# Patient Record
Sex: Female | Born: 1960 | Race: White | Hispanic: No | Marital: Single | Smoking: Former smoker
Health system: Southern US, Community
[De-identification: ages and names within clinical notes are randomized; demographics above are authoritative.]

## PROBLEM LIST (undated history)

## (undated) DIAGNOSIS — M1990 Unspecified osteoarthritis, unspecified site: Secondary | ICD-10-CM

## (undated) DIAGNOSIS — K8020 Calculus of gallbladder without cholecystitis without obstruction: Secondary | ICD-10-CM

## (undated) DIAGNOSIS — I10 Essential (primary) hypertension: Secondary | ICD-10-CM

## (undated) DIAGNOSIS — C801 Malignant (primary) neoplasm, unspecified: Secondary | ICD-10-CM

## (undated) DIAGNOSIS — E039 Hypothyroidism, unspecified: Secondary | ICD-10-CM

## (undated) DIAGNOSIS — E119 Type 2 diabetes mellitus without complications: Secondary | ICD-10-CM

## (undated) DIAGNOSIS — E079 Disorder of thyroid, unspecified: Secondary | ICD-10-CM

## (undated) DIAGNOSIS — O009 Unspecified ectopic pregnancy without intrauterine pregnancy: Secondary | ICD-10-CM

## (undated) DIAGNOSIS — E785 Hyperlipidemia, unspecified: Secondary | ICD-10-CM

## (undated) DIAGNOSIS — K219 Gastro-esophageal reflux disease without esophagitis: Secondary | ICD-10-CM

## (undated) HISTORY — PX: OTHER SURGICAL HISTORY: SHX169

## (undated) HISTORY — DX: Type 2 diabetes mellitus without complications: E11.9

## (undated) HISTORY — DX: Disorder of thyroid, unspecified: E07.9

## (undated) HISTORY — DX: Malignant (primary) neoplasm, unspecified: C80.1

## (undated) HISTORY — PX: ENDOMETRIAL ABLATION: SHX621

## (undated) HISTORY — DX: Gastro-esophageal reflux disease without esophagitis: K21.9

## (undated) HISTORY — PX: TUBAL LIGATION: SHX77

## (undated) HISTORY — DX: Essential (primary) hypertension: I10

## (undated) HISTORY — DX: Unspecified ectopic pregnancy without intrauterine pregnancy: O00.90

## (undated) HISTORY — DX: Hyperlipidemia, unspecified: E78.5

---

## 2016-09-26 DIAGNOSIS — F17211 Nicotine dependence, cigarettes, in remission: Secondary | ICD-10-CM | POA: Diagnosis not present

## 2016-09-26 DIAGNOSIS — C3411 Malignant neoplasm of upper lobe, right bronchus or lung: Secondary | ICD-10-CM | POA: Diagnosis not present

## 2016-10-14 ENCOUNTER — Encounter: Payer: Self-pay | Admitting: *Deleted

## 2016-10-14 DIAGNOSIS — E785 Hyperlipidemia, unspecified: Secondary | ICD-10-CM | POA: Insufficient documentation

## 2016-10-14 DIAGNOSIS — E119 Type 2 diabetes mellitus without complications: Secondary | ICD-10-CM

## 2016-10-14 DIAGNOSIS — E7800 Pure hypercholesterolemia, unspecified: Secondary | ICD-10-CM

## 2016-10-14 DIAGNOSIS — I10 Essential (primary) hypertension: Secondary | ICD-10-CM | POA: Insufficient documentation

## 2016-10-14 DIAGNOSIS — O009 Unspecified ectopic pregnancy without intrauterine pregnancy: Secondary | ICD-10-CM | POA: Insufficient documentation

## 2016-10-14 DIAGNOSIS — C801 Malignant (primary) neoplasm, unspecified: Secondary | ICD-10-CM | POA: Insufficient documentation

## 2016-10-14 DIAGNOSIS — K219 Gastro-esophageal reflux disease without esophagitis: Secondary | ICD-10-CM

## 2016-10-14 DIAGNOSIS — E079 Disorder of thyroid, unspecified: Secondary | ICD-10-CM | POA: Insufficient documentation

## 2016-10-15 ENCOUNTER — Encounter: Payer: Self-pay | Admitting: *Deleted

## 2016-10-15 ENCOUNTER — Encounter: Payer: Self-pay | Admitting: Thoracic Surgery (Cardiothoracic Vascular Surgery)

## 2016-10-15 ENCOUNTER — Institutional Professional Consult (permissible substitution) (INDEPENDENT_AMBULATORY_CARE_PROVIDER_SITE_OTHER): Payer: BLUE CROSS/BLUE SHIELD | Admitting: Thoracic Surgery (Cardiothoracic Vascular Surgery)

## 2016-10-15 ENCOUNTER — Other Ambulatory Visit: Payer: Self-pay | Admitting: *Deleted

## 2016-10-15 VITALS — BP 114/66 | HR 92 | Resp 16 | Ht 73.0 in | Wt 260.0 lb

## 2016-10-15 DIAGNOSIS — C3411 Malignant neoplasm of upper lobe, right bronchus or lung: Secondary | ICD-10-CM

## 2016-10-15 DIAGNOSIS — C801 Malignant (primary) neoplasm, unspecified: Secondary | ICD-10-CM

## 2016-10-15 DIAGNOSIS — C3491 Malignant neoplasm of unspecified part of right bronchus or lung: Secondary | ICD-10-CM

## 2016-10-15 NOTE — Progress Notes (Signed)
PCP is Ailene Rud, NP Referring Provider is No ref. provider found  Chief Complaint  Patient presents with  . Lung Lesion    RUL..CT LUNG BX 09/03/16, CT CHEST 07/15/16, PET 08/21/16, CT BRAIN 09/11/16, PFT  . Lung Cancer    ADENOCARCINOMA    HPI: [name] is sent for consultation regarding a right upper lobe lung cancer.  [name] is a 56 year old woman with a history of tobacco abuse (up to 2 packs per day for 44 years, currently less than 1 pack per day). She also has a history of hypertension, hyperlipidemia, type 2 diabetes, arthritis, cervical cancer and "thyroid disease."  She take 2-1/2 month history of a persistent cough. A CT chest showed a 2.6 cm mixed solid and groundglass nodule in the right upper lobe. The solid component was 1.3 cm. She also had a 9 mm groundglass nodule in the left upper lobe. There was a 5 mm subtle groundglass nodule in the superior segment of the right lower lobe. She had a PET/CT which showed this was hypermetabolic with an SUV of 3.9. There was no suspicious adenopathy is no evidence of regional or distant metastases. A CT-guided biopsy was done on 09/03/2016. It showed adenocarcinoma consistent with lung primary.  Her cough has improved, and she now has an occasional dry cough. She denies any chest pain, pressure, or tightness with exertion. She denies any shortness of breath other than with heavy exertion. She has not had any wheezing. She denies any headaches or visual changes. She has not had any change in appetite or weight loss. She does have arthritis particularly in her right knee, but does not had any new bone or joint pain.  Zubrod Score: At the time of surgery this patient's most appropriate activity status/level should be described as: '[x]'$     0    Normal activity, no symptoms '[]'$     1    Restricted in physical strenuous activity but ambulatory, able to do out light work '[]'$     2    Ambulatory and capable of self care, unable to do work  activities, up and about >50 % of waking hours                              '[]'$     3    Only limited self care, in bed greater than 50% of waking hours '[]'$     4    Completely disabled, no self care, confined to bed or chair '[]'$     5    Moribund    Past Medical History:  Diagnosis Date  . Cancer [facility])    h/o cervical  . Cancer ([place])    RULobe of the lung.Marland Kitchenadenocarcinoma per CT BX 09/03/16  . Diabetes mellitus without complication ([place])   . Ectopic pregnancy   . GERD (gastroesophageal reflux disease)   . Hyperlipidemia   . Hypertension   . Thyroid disease    hypothyroidism    Past Surgical History:  Procedure Laterality Date  . dilatation and curettage    . ENDOMETRIAL ABLATION    . resection of the left oviduct and ovary      Family History  Problem Relation Age of Onset  . Heart disease Mother   . Heart disease Father   . Diabetes Sister   . Hypertension Sister   . Cancer Maternal Aunt     BREAST CANCER    Social History Social History  Substance Use Topics  . Smoking status: Current Every Day Smoker    Packs/day: 2.00    Years: 44.00  . Smokeless tobacco: Never Used     Comment: BARELY A  PACK  . Alcohol use No    Current Outpatient Prescriptions  Medication Sig Dispense Refill  . amoxicillin (AMOXIL) 875 MG tablet Take 875 mg by mouth 2 (two) times daily.    Marland Kitchen aspirin EC 81 MG tablet Take 81 mg by mouth daily.    Marland Kitchen desloratadine (CLARINEX) 5 MG tablet Take 5 mg by mouth daily.    . ergocalciferol (VITAMIN D2) 50000 units capsule Take 50,000 Units by mouth once a week. X 8 weeks    . Ferrous Fumarate 29 MG TABS Take 1 tablet by mouth daily.    . Garlic 268 MG CAPS Take 1 capsule by mouth daily.    Marland Kitchen gemfibrozil (LOPID) 600 MG tablet Take 600 mg by mouth 2 (two) times daily before a meal.    . insulin glargine (LANTUS) 100 UNIT/ML injection Inject 20 Units into the skin 2 (two) times daily.     Marland Kitchen levothyroxine (SYNTHROID, LEVOTHROID) 50 MCG tablet Take 50 mcg  by mouth daily before breakfast.    . lisinopril (PRINIVIL,ZESTRIL) 20 MG tablet Take 20 mg by mouth daily.    . meclizine (ANTIVERT) 25 MG tablet Take 25 mg by mouth 3 (three) times daily as needed for dizziness.    . metFORMIN (GLUCOPHAGE) 1000 MG tablet Take 1,000 mg by mouth 2 (two) times daily with a meal.    . Omega-3 Fatty Acids (FISH OIL) 1000 MG CAPS Take 1 capsule by mouth daily.    Marland Kitchen omeprazole (PRILOSEC) 20 MG capsule Take 20 mg by mouth 2 (two) times daily before a meal.    . simvastatin (ZOCOR) 80 MG tablet Take 80 mg by mouth daily.    Marland Kitchen triamterene-hydrochlorothiazide (MAXZIDE-25) 37.5-25 MG tablet Take 1 tablet by mouth daily.     No current facility-administered medications for this visit.     No Known Allergies  Review of Systems  Constitutional: Negative for activity change, appetite change, chills, fever and unexpected weight change.  HENT: Positive for dental problem. Negative for trouble swallowing and voice change.   Eyes: Negative for visual disturbance.  Respiratory: Positive for cough. Negative for shortness of breath and wheezing.   Cardiovascular: Positive for leg swelling. Negative for chest pain and palpitations.  Gastrointestinal: Positive for abdominal pain. Negative for blood in stool.       Gall stones  Genitourinary: Positive for frequency. Negative for dysuria and hematuria.  Musculoskeletal: Positive for arthralgias and joint swelling.  Neurological: Negative for speech difficulty, weakness and headaches.  Hematological: Negative for adenopathy. Bruises/bleeds easily.  All other systems reviewed and are negative.   BP 114/66 (BP Location: Right Arm, Patient Position: Sitting, Cuff Size: Large)   Pulse 92   Resp 16   Ht '6\' 1"'$  (1.854 m)   Wt 260 lb (117.9 kg)   SpO2 97% Comment: ON RA  BMI 34.30 kg/m  Physical Exam  Constitutional: She is oriented to person, place, and time.  obese  HENT:  Head: Normocephalic and atraumatic.  Mouth/Throat:  No oropharyngeal exudate.  Eyes: Conjunctivae and EOM are normal. Pupils are equal, round, and reactive to light. No scleral icterus.  Neck: Neck supple. No thyromegaly present.  Cardiovascular: Normal rate, regular rhythm, normal heart sounds and intact distal pulses.  Exam reveals no gallop and no friction rub.  No murmur heard. Pulmonary/Chest: Effort normal and breath sounds normal. No respiratory distress. She has no wheezes. She has no rales.  Abdominal: Soft. She exhibits no distension. There is no tenderness.  Musculoskeletal: She exhibits no edema.  Lymphadenopathy:    She has no cervical adenopathy.  Neurological: She is alert and oriented to person, place, and time. No cranial nerve deficit.  Motor 5/5 bilaterally  Skin: Skin is warm and dry.  Vitals reviewed.    Diagnostic Tests: I personally reviewed the images from her PET/CT and CT guided biopsy that were done at [facility].  Official report on PET CT Impression 1. Malignant range FDG uptake is associated with the right upper lobe some solid nodule. This is suspicious for primary pulmonary neoplasm, favor adenocarcinoma. No hypermetabolic hilar or mediastinal lymph nodes and no evidence for distant metastatic disease. 2. Aortic atherosclerosis 3. Gallstones  Biopsy positive for adenocarcinoma  Pulmonary function testing FVC   3.05 (90%) FEV1  2.49 (91%) FEV1  2.52 (92%) post bronchodilator VC  3.05 (90%) DLCO 18.36 (73%)  Impression: 56 year old woman with history of tobacco abuse who has a mixed solid and sub solid nodule in the right upper lobe. The solid component measures 1.3 cm and the overall nodule is about 2.5 cm. Biopsy proven adenocarcinoma.  I recommended that we proceed with right VATS for a right upper lobectomy for definitive treatment of the invasive adenocarcinoma. I have discussed the general nature of the procedure, the need for general anesthesia, and the incisions to be used with Ms.  Rojas. We discussed the expected hospital stay, overall recovery and short and long term outcomes. I informed her of the indications, risks, benefits, and alternatives. She typically had questions regarding stereotactic radiation. She understand the risks of surgery include, but are not limited to death, stroke, MI, DVT/PE, bleeding, possible need for transfusion, infections, cardiac arrhythmias, as well as other organ system dysfunction including respiratory, renal, or GI complications.   She accepts the risk and agrees to proceed  Hypertension-blood pressure normal today on Maxide  Hyperlipidemia- currently being treated with Zocor and Lopid  Type 2 diabetes- on metformin and insulin  Plan: Right VATS, right upper lobectomy on Thursday, 10/26/2016  Melrose Nakayama, MD [facility] [phone]

## 2016-10-17 LAB — PULMONARY FUNCTION TEST

## 2016-10-28 ENCOUNTER — Encounter (HOSPITAL_COMMUNITY): Payer: Self-pay | Admitting: *Deleted

## 2016-10-28 ENCOUNTER — Encounter (HOSPITAL_COMMUNITY)
Admission: RE | Admit: 2016-10-28 | Discharge: 2016-10-28 | Disposition: A | Discharge status: Home / Self Care | Payer: BLUE CROSS/BLUE SHIELD | Source: Ambulatory Visit | Attending: Thoracic Surgery (Cardiothoracic Vascular Surgery) | Admitting: Thoracic Surgery (Cardiothoracic Vascular Surgery)

## 2016-10-28 ENCOUNTER — Ambulatory Visit (HOSPITAL_COMMUNITY)
Admission: RE | Admit: 2016-10-28 | Discharge: 2016-10-28 | Disposition: A | Discharge status: Home / Self Care | Payer: BLUE CROSS/BLUE SHIELD | Source: Ambulatory Visit | Attending: Thoracic Surgery (Cardiothoracic Vascular Surgery) | Admitting: Thoracic Surgery (Cardiothoracic Vascular Surgery)

## 2016-10-28 DIAGNOSIS — Z01812 Encounter for preprocedural laboratory examination: Secondary | ICD-10-CM | POA: Insufficient documentation

## 2016-10-28 DIAGNOSIS — Z0183 Encounter for blood typing: Secondary | ICD-10-CM | POA: Insufficient documentation

## 2016-10-28 DIAGNOSIS — C3491 Malignant neoplasm of unspecified part of right bronchus or lung: Secondary | ICD-10-CM | POA: Diagnosis not present

## 2016-10-28 DIAGNOSIS — Z01818 Encounter for other preprocedural examination: Secondary | ICD-10-CM | POA: Diagnosis not present

## 2016-10-28 HISTORY — DX: Unspecified osteoarthritis, unspecified site: M19.90

## 2016-10-28 HISTORY — DX: Calculus of gallbladder without cholecystitis without obstruction: K80.20

## 2016-10-28 HISTORY — DX: Hypothyroidism, unspecified: E03.9

## 2016-10-28 LAB — BLOOD GAS, ARTERIAL
Acid-base deficit: 1 mmol/L (ref 0.0–2.0)
BICARBONATE: 23.3 mmol/L (ref 20.0–28.0)
Drawn by: 421801
FIO2: 0.21
O2 Saturation: 94.5 %
PH ART: 7.389 (ref 7.350–7.450)
PO2 ART: 70.2 mmHg — AB (ref 83.0–108.0)
Patient temperature: 98.6
pCO2 arterial: 39.4 mmHg (ref 32.0–48.0)

## 2016-10-28 LAB — URINALYSIS, ROUTINE W REFLEX MICROSCOPIC
BILIRUBIN URINE: NEGATIVE
GLUCOSE, UA: NEGATIVE mg/dL
Hgb urine dipstick: NEGATIVE
KETONES UR: NEGATIVE mg/dL
Nitrite: NEGATIVE
PROTEIN: NEGATIVE mg/dL
Specific Gravity, Urine: 1.015 (ref 1.005–1.030)
pH: 5 (ref 5.0–8.0)

## 2016-10-28 LAB — COMPREHENSIVE METABOLIC PANEL
ALK PHOS: 57 U/L (ref 38–126)
ALT: 20 U/L (ref 14–54)
ANION GAP: 10 (ref 5–15)
AST: 27 U/L (ref 15–41)
Albumin: 3.9 g/dL (ref 3.5–5.0)
BILIRUBIN TOTAL: 0.2 mg/dL — AB (ref 0.3–1.2)
BUN: 19 mg/dL (ref 6–20)
CALCIUM: 9.1 mg/dL (ref 8.9–10.3)
CO2: 22 mmol/L (ref 22–32)
Chloride: 109 mmol/L (ref 101–111)
Creatinine, Ser: 1.22 mg/dL — ABNORMAL HIGH (ref 0.44–1.00)
GFR calc Af Amer: 56 mL/min — ABNORMAL LOW (ref >60)
GFR calc non Af Amer: 49 mL/min — ABNORMAL LOW (ref >60)
Glucose, Bld: 166 mg/dL — ABNORMAL HIGH (ref 65–99)
Potassium: 4.1 mmol/L (ref 3.5–5.1)
Sodium: 141 mmol/L (ref 135–145)
Total Protein: 6.6 g/dL (ref 6.5–8.1)

## 2016-10-28 LAB — CBC
HEMATOCRIT: 35.6 % — AB (ref 36.0–46.0)
HEMOGLOBIN: 11.8 g/dL — AB (ref 12.0–15.0)
MCH: 30.6 pg (ref 26.0–34.0)
MCHC: 33.1 g/dL (ref 30.0–36.0)
MCV: 92.5 fL (ref 78.0–100.0)
Platelets: 256 10*3/uL (ref 150–400)
RBC: 3.85 MIL/uL — ABNORMAL LOW (ref 3.87–5.11)
RDW: 13 % (ref 11.5–15.5)
WBC: 7.9 10*3/uL (ref 4.0–10.5)

## 2016-10-28 LAB — SURGICAL PCR SCREEN
MRSA, PCR: NEGATIVE
Staphylococcus aureus: NEGATIVE

## 2016-10-28 LAB — PROTIME-INR
INR: 0.99
Prothrombin Time: 13.1 seconds (ref 11.4–15.2)

## 2016-10-28 LAB — APTT: APTT: 28 s (ref 24–36)

## 2016-10-28 NOTE — Progress Notes (Signed)
PCP is Ailene Rud, NP Denies ever seeing a cardiologist. Denies ever having a stress test, card cath, or echo. Reports her fasting cbgs run 120's

## 2016-10-28 NOTE — Pre-Procedure Instructions (Signed)
[name]  10/28/2016      [facility] - [place], [place] - [address] AT [address] [place] [address] Phone: [phone] Fax: [phone]    Your procedure is scheduled on Jan 24  Report to [facility] at [phone].  Call this number if you have problems the morning of surgery:  [phone]   Remember:  Do not eat food or drink liquids after midnight.  Take these medicines the morning of surgery with A SIP OF WATER Bupropion (WellbutrinXL), Clarinex, Synthroid, Omeprazole (Prilosec),   Stop/ Take aspirin as directed by your Dr.  Stop taking BC's, Goody's, Herbal medications, Fish Oil, ibuprofen, Advil, Motrin, Aleve, Vitamins    How to Manage Your Diabetes Before and After Surgery  Why is it important to control my blood sugar before and after surgery? . Improving blood sugar levels before and after surgery helps healing and can limit problems. . A way of improving blood sugar control is eating a healthy diet by: o  Eating less sugar and carbohydrates o  Increasing activity/exercise o  Talking with your doctor about reaching your blood sugar goals . High blood sugars (greater than 180 mg/dL) can raise your risk of infections and slow your recovery, so you will need to focus on controlling your diabetes during the weeks before surgery. . Make sure that the doctor who takes care of your diabetes knows about your planned surgery including the date and location.  How do I manage my blood sugar before surgery? . Check your blood sugar at least 4 times a day, starting 2 days before surgery, to make sure that the level is not too high or low. o Check your blood sugar the morning of your surgery when you wake up and every 2 hours until you get to the Short Stay unit. . If your blood sugar is less than 70 mg/dL, you will need to treat for low blood sugar: o Do not take insulin. o Treat a low blood sugar  (less than 70 mg/dL) with  cup of clear juice (cranberry or apple), 4 glucose tablets, OR glucose gel. o Recheck blood sugar in 15 minutes after treatment (to make sure it is greater than 70 mg/dL). If your blood sugar is not greater than 70 mg/dL on recheck, call [phone] for further instructions. . Report your blood sugar to the short stay nurse when you get to Short Stay.  . If you are admitted to the hospital after surgery: o Your blood sugar will be checked by the staff and you will probably be given insulin after surgery (instead of oral diabetes medicines) to make sure you have good blood sugar levels. o The goal for blood sugar control after surgery is 80-180 mg/dL      WHAT DO I DO ABOUT MY DIABETES MEDICATION?   Marland Kitchen Do not take oral diabetes medicines (pills) the morning of surgery. Metformin (Glucophage)  . THE NIGHT BEFORE SURGERY, take ___________ units of ___________insulin.       Marland Kitchen HE MORNING OF SURGERY, take _____________ units of __________insulin.  . The day of surgery, do not take other diabetes injectables, including Byetta (exenatide), Bydureon (exenatide ER), Victoza (liraglutide), or Trulicity (dulaglutide).  . If your CBG is greater than 220 mg/dL, you may take  of your sliding scale (correction) dose of insulin.  Other Instructions:          Patient Signature:  Date:   Nurse Signature:  Date:   Reviewed and Endorsed by [facility] Patient Education Committee, August 2015  Do not wear jewelry, make-up or nail polish.  Do not wear lotions, powders, or perfumes, or deoderant.  Do not shave 48 hours prior to surgery.  Men may shave face and neck.  Do not bring valuables to the hospital.  [facility] is not responsible for any belongings or valuables.  Contacts, dentures or bridgework may not be worn into surgery.  Leave your suitcase in the car.  After surgery it may be brought to your room.  For patients admitted to the hospital, discharge time  will be determined by your treatment team.  Patients discharged the day of surgery will not be allowed to drive home.   Special instructions:  [facility] - Preparing for Surgery  Before surgery, you can play an important role.  Because skin is not sterile, your skin needs to be as free of germs as possible.  You can reduce the number of germs on you skin by washing with CHG (chlorahexidine gluconate) soap before surgery.  CHG is an antiseptic cleaner which kills germs and bonds with the skin to continue killing germs even after washing.  Please DO NOT use if you have an allergy to CHG or antibacterial soaps.  If your skin becomes reddened/irritated stop using the CHG and inform your nurse when you arrive at Short Stay.  Do not shave (including legs and underarms) for at least 48 hours prior to the first CHG shower.  You may shave your face.  Please follow these instructions carefully:   1.  Shower with CHG Soap the night before surgery and the  morning of Surgery.  2.  If you choose to wash your hair, wash your hair first as usual with your normal shampoo.  3.  After you shampoo, rinse your hair and body thoroughly to remove the  Shampoo.  4.  Use CHG as you would any other liquid soap.  You can apply chg directly  to the skin and wash gently with scrungie or a clean washcloth.  5.  Apply the CHG Soap to your body ONLY FROM THE NECK DOWN.   Do not use on open wounds or open sores.  Avoid contact with your eyes,       ears, mouth and genitals (private parts).  Wash genitals (private parts)   with your normal soap.  6.  Wash thoroughly, paying special attention to the area where your surgery  will be performed.  7.  Thoroughly rinse your body with warm water from the neck down.  8.  DO NOT shower/wash with your normal soap after using and rinsing off the CHG Soap.  9.  Pat yourself dry with a clean towel.            10.  Wear clean pajamas.            11.  Place clean sheets on your bed the  night of your first shower and do not sleep with pets.  Day of Surgery  Do not apply any lotions/deoderants the morning of surgery.  Please wear clean clothes to the hospital/surgery center.    Please read over the following fact sheets that you were given. Coughing and Deep Breathing, MRSA Information and Surgical Site Infection Prevention

## 2016-10-29 LAB — HEMOGLOBIN A1C
HEMOGLOBIN A1C: 6.8 % — AB (ref 4.8–5.6)
Mean Plasma Glucose: 148 mg/dL

## 2016-10-29 LAB — ABO/RH: ABO/RH(D): AB POS

## 2016-10-30 ENCOUNTER — Inpatient Hospital Stay (HOSPITAL_COMMUNITY): Payer: BLUE CROSS/BLUE SHIELD

## 2016-10-30 ENCOUNTER — Inpatient Hospital Stay (HOSPITAL_COMMUNITY): Payer: BLUE CROSS/BLUE SHIELD | Admitting: Anesthesiology

## 2016-10-30 ENCOUNTER — Encounter (HOSPITAL_COMMUNITY): Payer: Self-pay | Admitting: Certified Registered Nurse Anesthetist

## 2016-10-30 ENCOUNTER — Encounter (HOSPITAL_COMMUNITY)
Admission: RE | Disposition: A | Discharge status: Home / Self Care | Payer: Self-pay | Source: Ambulatory Visit | Attending: Thoracic Surgery (Cardiothoracic Vascular Surgery)

## 2016-10-30 ENCOUNTER — Inpatient Hospital Stay (HOSPITAL_COMMUNITY)
Admission: RE | Admit: 2016-10-30 | Discharge: 2016-11-05 | DRG: 164 | Disposition: A | Discharge status: Home / Self Care | Payer: BLUE CROSS/BLUE SHIELD | Source: Ambulatory Visit | Attending: Thoracic Surgery (Cardiothoracic Vascular Surgery) | Admitting: Thoracic Surgery (Cardiothoracic Vascular Surgery)

## 2016-10-30 DIAGNOSIS — E079 Disorder of thyroid, unspecified: Secondary | ICD-10-CM | POA: Diagnosis present

## 2016-10-30 DIAGNOSIS — Z8541 Personal history of malignant neoplasm of cervix uteri: Secondary | ICD-10-CM | POA: Diagnosis not present

## 2016-10-30 DIAGNOSIS — E669 Obesity, unspecified: Secondary | ICD-10-CM | POA: Diagnosis present

## 2016-10-30 DIAGNOSIS — K5900 Constipation, unspecified: Secondary | ICD-10-CM | POA: Diagnosis not present

## 2016-10-30 DIAGNOSIS — E785 Hyperlipidemia, unspecified: Secondary | ICD-10-CM | POA: Diagnosis present

## 2016-10-30 DIAGNOSIS — Z79899 Other long term (current) drug therapy: Secondary | ICD-10-CM

## 2016-10-30 DIAGNOSIS — C3411 Malignant neoplasm of upper lobe, right bronchus or lung: Principal | ICD-10-CM | POA: Diagnosis present

## 2016-10-30 DIAGNOSIS — D62 Acute posthemorrhagic anemia: Secondary | ICD-10-CM | POA: Diagnosis not present

## 2016-10-30 DIAGNOSIS — Z902 Acquired absence of lung [part of]: Secondary | ICD-10-CM

## 2016-10-30 DIAGNOSIS — F17200 Nicotine dependence, unspecified, uncomplicated: Secondary | ICD-10-CM | POA: Diagnosis present

## 2016-10-30 DIAGNOSIS — Z23 Encounter for immunization: Secondary | ICD-10-CM | POA: Diagnosis not present

## 2016-10-30 DIAGNOSIS — M1990 Unspecified osteoarthritis, unspecified site: Secondary | ICD-10-CM | POA: Diagnosis present

## 2016-10-30 DIAGNOSIS — Z7982 Long term (current) use of aspirin: Secondary | ICD-10-CM | POA: Diagnosis not present

## 2016-10-30 DIAGNOSIS — Z6834 Body mass index (BMI) 34.0-34.9, adult: Secondary | ICD-10-CM

## 2016-10-30 DIAGNOSIS — Z794 Long term (current) use of insulin: Secondary | ICD-10-CM | POA: Diagnosis not present

## 2016-10-30 DIAGNOSIS — Z09 Encounter for follow-up examination after completed treatment for conditions other than malignant neoplasm: Secondary | ICD-10-CM

## 2016-10-30 DIAGNOSIS — J939 Pneumothorax, unspecified: Secondary | ICD-10-CM | POA: Diagnosis present

## 2016-10-30 DIAGNOSIS — I10 Essential (primary) hypertension: Secondary | ICD-10-CM | POA: Diagnosis present

## 2016-10-30 DIAGNOSIS — C3491 Malignant neoplasm of unspecified part of right bronchus or lung: Secondary | ICD-10-CM

## 2016-10-30 DIAGNOSIS — G8918 Other acute postprocedural pain: Secondary | ICD-10-CM

## 2016-10-30 DIAGNOSIS — E119 Type 2 diabetes mellitus without complications: Secondary | ICD-10-CM | POA: Diagnosis present

## 2016-10-30 HISTORY — PX: VIDEO ASSISTED THORACOSCOPY (VATS)/ LOBECTOMY: SHX6169

## 2016-10-30 LAB — GLUCOSE, CAPILLARY
GLUCOSE-CAPILLARY: 124 mg/dL — AB (ref 65–99)
GLUCOSE-CAPILLARY: 179 mg/dL — AB (ref 65–99)
GLUCOSE-CAPILLARY: 189 mg/dL — AB (ref 65–99)
GLUCOSE-CAPILLARY: 197 mg/dL — AB (ref 65–99)
Glucose-Capillary: 213 mg/dL — ABNORMAL HIGH (ref 65–99)

## 2016-10-30 LAB — PREPARE RBC (CROSSMATCH)

## 2016-10-30 SURGERY — VIDEO ASSISTED THORACOSCOPY (VATS)/ LOBECTOMY
Anesthesia: General | Site: Chest | Laterality: Right

## 2016-10-30 MED ORDER — ASPIRIN EC 81 MG PO TBEC
81.0000 mg | DELAYED_RELEASE_TABLET | Freq: Every day | ORAL | Status: DC
  Administered 2016-10-31 – 2016-11-05 (×6): 81 mg via ORAL
  Filled 2016-10-30 (×6): qty 1

## 2016-10-30 MED ORDER — PHENYLEPHRINE 40 MCG/ML (10ML) SYRINGE FOR IV PUSH (FOR BLOOD PRESSURE SUPPORT)
PREFILLED_SYRINGE | INTRAVENOUS | Status: AC
  Filled 2016-10-30: qty 10

## 2016-10-30 MED ORDER — TRAMADOL HCL 50 MG PO TABS
50.0000 mg | ORAL_TABLET | Freq: Four times a day (QID) | ORAL | Status: DC | PRN
  Administered 2016-10-31: 100 mg via ORAL
  Filled 2016-10-30: qty 2

## 2016-10-30 MED ORDER — ONDANSETRON HCL 4 MG/2ML IJ SOLN: 4.0000 mg | Freq: Four times a day (QID) | INTRAMUSCULAR | Status: DC | PRN

## 2016-10-30 MED ORDER — BUPROPION HCL ER (XL) 150 MG PO TB24
150.0000 mg | ORAL_TABLET | Freq: Two times a day (BID) | ORAL | Status: DC
  Administered 2016-10-30 – 2016-11-05 (×12): 150 mg via ORAL
  Filled 2016-10-30 (×12): qty 1

## 2016-10-30 MED ORDER — LORATADINE 10 MG PO TABS
10.0000 mg | ORAL_TABLET | Freq: Every day | ORAL | Status: DC
  Administered 2016-10-31 – 2016-11-05 (×6): 10 mg via ORAL
  Filled 2016-10-30 (×6): qty 1

## 2016-10-30 MED ORDER — NALOXONE HCL 0.4 MG/ML IJ SOLN: 0.4000 mg | INTRAMUSCULAR | Status: DC | PRN

## 2016-10-30 MED ORDER — METFORMIN HCL 500 MG PO TABS
1000.0000 mg | ORAL_TABLET | Freq: Two times a day (BID) | ORAL | Status: DC
  Administered 2016-10-31 – 2016-11-05 (×11): 1000 mg via ORAL
  Filled 2016-10-30 (×11): qty 2

## 2016-10-30 MED ORDER — SUGAMMADEX SODIUM 200 MG/2ML IV SOLN
INTRAVENOUS | Status: DC | PRN
  Administered 2016-10-30: 200 mg via INTRAVENOUS

## 2016-10-30 MED ORDER — OXYCODONE HCL 5 MG PO TABS: 5.0000 mg | ORAL_TABLET | Freq: Once | ORAL | Status: DC | PRN

## 2016-10-30 MED ORDER — SUGAMMADEX SODIUM 200 MG/2ML IV SOLN
INTRAVENOUS | Status: AC
  Filled 2016-10-30: qty 2

## 2016-10-30 MED ORDER — DEXAMETHASONE SODIUM PHOSPHATE 10 MG/ML IJ SOLN
INTRAMUSCULAR | Status: DC | PRN
  Administered 2016-10-30: 10 mg via INTRAVENOUS

## 2016-10-30 MED ORDER — ROCURONIUM BROMIDE 50 MG/5ML IV SOSY
PREFILLED_SYRINGE | INTRAVENOUS | Status: AC
  Filled 2016-10-30: qty 5

## 2016-10-30 MED ORDER — FENTANYL CITRATE (PF) 100 MCG/2ML IJ SOLN
INTRAMUSCULAR | Status: DC | PRN
  Administered 2016-10-30 (×4): 50 ug via INTRAVENOUS
  Administered 2016-10-30: 100 ug via INTRAVENOUS
  Administered 2016-10-30 (×2): 50 ug via INTRAVENOUS

## 2016-10-30 MED ORDER — DEXAMETHASONE SODIUM PHOSPHATE 10 MG/ML IJ SOLN
INTRAMUSCULAR | Status: AC
  Filled 2016-10-30: qty 1

## 2016-10-30 MED ORDER — ONDANSETRON HCL 4 MG/2ML IJ SOLN
INTRAMUSCULAR | Status: AC
  Filled 2016-10-30: qty 2

## 2016-10-30 MED ORDER — OXYCODONE HCL 5 MG PO TABS
5.0000 mg | ORAL_TABLET | ORAL | Status: DC | PRN
  Administered 2016-10-30 (×2): 5 mg via ORAL
  Administered 2016-10-31: 10 mg via ORAL
  Administered 2016-10-31 (×2): 5 mg via ORAL
  Administered 2016-11-01 – 2016-11-05 (×9): 10 mg via ORAL
  Filled 2016-10-30: qty 2
  Filled 2016-10-30: qty 1
  Filled 2016-10-30 (×5): qty 2
  Filled 2016-10-30 (×2): qty 1
  Filled 2016-10-30 (×3): qty 2
  Filled 2016-10-30: qty 1
  Filled 2016-10-30 (×2): qty 2

## 2016-10-30 MED ORDER — 0.9 % SODIUM CHLORIDE (POUR BTL) OPTIME
TOPICAL | Status: DC | PRN
  Administered 2016-10-30: 2000 mL

## 2016-10-30 MED ORDER — DIPHENHYDRAMINE HCL 50 MG/ML IJ SOLN
12.5000 mg | Freq: Four times a day (QID) | INTRAMUSCULAR | Status: DC | PRN
Start: 2016-10-30 — End: 2016-11-02

## 2016-10-30 MED ORDER — FENTANYL 40 MCG/ML IV SOLN
INTRAVENOUS | Status: DC
  Administered 2016-10-30: 210 ug via INTRAVENOUS
  Administered 2016-10-30: 1000 ug via INTRAVENOUS
  Administered 2016-10-30: 30 ug via INTRAVENOUS
  Administered 2016-10-31: 150 ug via INTRAVENOUS
  Administered 2016-10-31: 1000 ug via INTRAVENOUS
  Administered 2016-10-31: 135 ug via INTRAVENOUS
  Administered 2016-10-31: 75 ug via INTRAVENOUS
  Administered 2016-10-31: 120 ug via INTRAVENOUS
  Administered 2016-10-31: 0 ug via INTRAVENOUS
  Administered 2016-11-01 (×2): 45 ug via INTRAVENOUS
  Administered 2016-11-01: 135 ug via INTRAVENOUS
  Administered 2016-11-01: 30 ug via INTRAVENOUS
  Administered 2016-11-01: 90 ug via INTRAVENOUS
  Administered 2016-11-02: 45 ug via INTRAVENOUS
  Administered 2016-11-02: 90 ug via INTRAVENOUS
  Administered 2016-11-02: 60 ug via INTRAVENOUS
  Administered 2016-11-02: 150 ug via INTRAVENOUS
  Filled 2016-10-30 (×5): qty 25

## 2016-10-30 MED ORDER — LACTATED RINGERS IV SOLN
INTRAVENOUS | Status: DC | PRN
  Administered 2016-10-30: 08:00:00 via INTRAVENOUS

## 2016-10-30 MED ORDER — ACETAMINOPHEN 500 MG PO TABS
1000.0000 mg | ORAL_TABLET | Freq: Four times a day (QID) | ORAL | Status: AC
  Administered 2016-10-30 – 2016-11-03 (×16): 1000 mg via ORAL
  Filled 2016-10-30 (×17): qty 2

## 2016-10-30 MED ORDER — LEVOTHYROXINE SODIUM 50 MCG PO TABS
50.0000 ug | ORAL_TABLET | Freq: Every day | ORAL | Status: DC
  Administered 2016-10-31 – 2016-11-05 (×6): 50 ug via ORAL
  Filled 2016-10-30 (×6): qty 1

## 2016-10-30 MED ORDER — TRIAMTERENE-HCTZ 37.5-25 MG PO TABS
1.0000 | ORAL_TABLET | Freq: Every day | ORAL | Status: DC
  Administered 2016-10-31 – 2016-11-03 (×3): 1 via ORAL
  Filled 2016-10-30 (×6): qty 1

## 2016-10-30 MED ORDER — BUPIVACAINE HCL (PF) 0.5 % IJ SOLN
INTRAMUSCULAR | Status: AC
  Filled 2016-10-30: qty 10

## 2016-10-30 MED ORDER — SODIUM CHLORIDE 0.9 % IV SOLN
30.0000 meq | Freq: Every day | INTRAVENOUS | Status: DC | PRN
  Filled 2016-10-30: qty 15

## 2016-10-30 MED ORDER — PROPOFOL 10 MG/ML IV BOLUS
INTRAVENOUS | Status: AC
  Filled 2016-10-30: qty 40

## 2016-10-30 MED ORDER — HYDROMORPHONE HCL 1 MG/ML IJ SOLN
INTRAMUSCULAR | Status: AC
  Filled 2016-10-30: qty 0.5

## 2016-10-30 MED ORDER — DIPHENHYDRAMINE HCL 12.5 MG/5ML PO ELIX: 12.5000 mg | ORAL_SOLUTION | Freq: Four times a day (QID) | ORAL | Status: DC | PRN

## 2016-10-30 MED ORDER — MIDAZOLAM HCL 2 MG/2ML IJ SOLN
INTRAMUSCULAR | Status: AC
  Filled 2016-10-30: qty 2

## 2016-10-30 MED ORDER — OXYCODONE HCL 5 MG/5ML PO SOLN: 5.0000 mg | Freq: Once | ORAL | Status: DC | PRN

## 2016-10-30 MED ORDER — BUPIVACAINE ON-Q PAIN PUMP (FOR ORDER SET NO CHG)
INJECTION | Status: AC
  Filled 2016-10-30: qty 1

## 2016-10-30 MED ORDER — PANTOPRAZOLE SODIUM 40 MG PO TBEC
40.0000 mg | DELAYED_RELEASE_TABLET | Freq: Every day | ORAL | Status: DC
  Administered 2016-10-31 – 2016-11-05 (×6): 40 mg via ORAL
  Filled 2016-10-30 (×6): qty 1

## 2016-10-30 MED ORDER — FENTANYL CITRATE (PF) 250 MCG/5ML IJ SOLN
INTRAMUSCULAR | Status: AC
  Filled 2016-10-30: qty 5

## 2016-10-30 MED ORDER — DEXTROSE 5 % IV SOLN
1.5000 g | INTRAVENOUS | Status: AC
  Administered 2016-10-30: 1.5 g via INTRAVENOUS
  Filled 2016-10-30: qty 1.5

## 2016-10-30 MED ORDER — PNEUMOCOCCAL VAC POLYVALENT 25 MCG/0.5ML IJ INJ
0.5000 mL | INJECTION | INTRAMUSCULAR | Status: AC
  Administered 2016-11-02: 0.5 mL via INTRAMUSCULAR
  Filled 2016-10-30 (×2): qty 0.5

## 2016-10-30 MED ORDER — ENOXAPARIN SODIUM 40 MG/0.4ML ~~LOC~~ SOLN
40.0000 mg | Freq: Every day | SUBCUTANEOUS | Status: DC
  Administered 2016-10-31 – 2016-11-04 (×5): 40 mg via SUBCUTANEOUS
  Filled 2016-10-30 (×6): qty 0.4

## 2016-10-30 MED ORDER — LEVALBUTEROL HCL 0.63 MG/3ML IN NEBU
0.6300 mg | INHALATION_SOLUTION | Freq: Four times a day (QID) | RESPIRATORY_TRACT | Status: DC
  Administered 2016-10-30 – 2016-11-02 (×12): 0.63 mg via RESPIRATORY_TRACT
  Filled 2016-10-30 (×12): qty 3

## 2016-10-30 MED ORDER — ONDANSETRON HCL 4 MG/2ML IJ SOLN
INTRAMUSCULAR | Status: DC | PRN
  Administered 2016-10-30: 4 mg via INTRAVENOUS

## 2016-10-30 MED ORDER — ROCURONIUM BROMIDE 100 MG/10ML IV SOLN
INTRAVENOUS | Status: DC | PRN
  Administered 2016-10-30: 10 mg via INTRAVENOUS
  Administered 2016-10-30: 50 mg via INTRAVENOUS
  Administered 2016-10-30: 30 mg via INTRAVENOUS
  Administered 2016-10-30: 50 mg via INTRAVENOUS

## 2016-10-30 MED ORDER — LISINOPRIL 20 MG PO TABS
20.0000 mg | ORAL_TABLET | Freq: Every evening | ORAL | Status: DC
  Administered 2016-10-31 – 2016-11-04 (×3): 20 mg via ORAL
  Filled 2016-10-30 (×4): qty 1

## 2016-10-30 MED ORDER — BISACODYL 5 MG PO TBEC
10.0000 mg | DELAYED_RELEASE_TABLET | Freq: Every day | ORAL | Status: DC
  Administered 2016-10-30 – 2016-11-05 (×6): 10 mg via ORAL
  Filled 2016-10-30 (×7): qty 2

## 2016-10-30 MED ORDER — HEMOSTATIC AGENTS (NO CHARGE) OPTIME
TOPICAL | Status: DC | PRN
  Administered 2016-10-30: 1 via TOPICAL

## 2016-10-30 MED ORDER — SODIUM CHLORIDE 0.9 % IV SOLN
INTRAVENOUS | Status: DC
  Administered 2016-10-30 – 2016-10-31 (×3): via INTRAVENOUS

## 2016-10-30 MED ORDER — INSULIN ASPART 100 UNIT/ML ~~LOC~~ SOLN
0.0000 [IU] | SUBCUTANEOUS | Status: DC
  Administered 2016-10-30: 8 [IU] via SUBCUTANEOUS
  Administered 2016-10-30 (×2): 4 [IU] via SUBCUTANEOUS
  Administered 2016-10-31: 2 [IU] via SUBCUTANEOUS

## 2016-10-30 MED ORDER — BUPIVACAINE 0.5 % ON-Q PUMP SINGLE CATH 400 ML
400.0000 mL | INJECTION | Status: DC
Start: 2016-10-30 — End: 2016-10-30
  Filled 2016-10-30 (×2): qty 400

## 2016-10-30 MED ORDER — HYDROMORPHONE HCL 1 MG/ML IJ SOLN
0.2500 mg | INTRAMUSCULAR | Status: DC | PRN
  Administered 2016-10-30 (×3): 0.5 mg via INTRAVENOUS

## 2016-10-30 MED ORDER — SENNOSIDES-DOCUSATE SODIUM 8.6-50 MG PO TABS
1.0000 | ORAL_TABLET | Freq: Every day | ORAL | Status: DC
  Administered 2016-10-30 – 2016-11-04 (×5): 1 via ORAL
  Filled 2016-10-30 (×5): qty 1

## 2016-10-30 MED ORDER — BUPIVACAINE HCL (PF) 0.5 % IJ SOLN
INTRAMUSCULAR | Status: DC | PRN
  Administered 2016-10-30: 10 mL

## 2016-10-30 MED ORDER — SODIUM CHLORIDE 0.9% FLUSH: 9.0000 mL | INTRAVENOUS | Status: DC | PRN

## 2016-10-30 MED ORDER — GEMFIBROZIL 600 MG PO TABS
600.0000 mg | ORAL_TABLET | Freq: Two times a day (BID) | ORAL | Status: DC
  Administered 2016-10-30 – 2016-11-05 (×12): 600 mg via ORAL
  Filled 2016-10-30 (×15): qty 1

## 2016-10-30 MED ORDER — MIDAZOLAM HCL 5 MG/5ML IJ SOLN
INTRAMUSCULAR | Status: DC | PRN
  Administered 2016-10-30 (×2): 1 mg via INTRAVENOUS

## 2016-10-30 MED ORDER — ACETAMINOPHEN 160 MG/5ML PO SOLN
1000.0000 mg | Freq: Four times a day (QID) | ORAL | Status: AC
  Administered 2016-11-03 – 2016-11-04 (×3): 1000 mg via ORAL
  Filled 2016-10-30 (×3): qty 40.6

## 2016-10-30 MED ORDER — ATORVASTATIN CALCIUM 40 MG PO TABS
40.0000 mg | ORAL_TABLET | Freq: Every day | ORAL | Status: DC
  Administered 2016-10-30 – 2016-11-04 (×6): 40 mg via ORAL
  Filled 2016-10-30 (×6): qty 1

## 2016-10-30 MED ORDER — DEXTROSE 5 % IV SOLN
1.5000 g | Freq: Two times a day (BID) | INTRAVENOUS | Status: AC
  Administered 2016-10-30 – 2016-10-31 (×2): 1.5 g via INTRAVENOUS
  Filled 2016-10-30 (×3): qty 1.5

## 2016-10-30 MED ORDER — PROPOFOL 10 MG/ML IV BOLUS
INTRAVENOUS | Status: DC | PRN
  Administered 2016-10-30: 80 mg via INTRAVENOUS
  Administered 2016-10-30: 200 mg via INTRAVENOUS

## 2016-10-30 SURGICAL SUPPLY — 100 items
APPLIER CLIP 5 13 M/L LIGAMAX5 (MISCELLANEOUS) ×3
APPLIER CLIP ROT 10 11.4 M/L (STAPLE)
BLADE SURG 11 STRL SS (BLADE) ×3 IMPLANT
CANISTER SUCTION 2500CC (MISCELLANEOUS) ×3 IMPLANT
CATH KIT ON Q 5IN SLV (PAIN MANAGEMENT) IMPLANT
CATH THORACIC 28FR (CATHETERS) IMPLANT
CATH THORACIC 28FR RT ANG (CATHETERS) IMPLANT
CATH THORACIC 36FR (CATHETERS) IMPLANT
CATH THORACIC 36FR RT ANG (CATHETERS) IMPLANT
CLIP APPLIE 5 13 M/L LIGAMAX5 (MISCELLANEOUS) ×1 IMPLANT
CLIP APPLIE ROT 10 11.4 M/L (STAPLE) IMPLANT
CLIP TI MEDIUM 24 (CLIP) ×3 IMPLANT
CLIP TI MEDIUM 6 (CLIP) ×3 IMPLANT
CONN ST 1/4X3/8  BEN (MISCELLANEOUS) ×4
CONN ST 1/4X3/8 BEN (MISCELLANEOUS) ×2 IMPLANT
CONN Y 3/8X3/8X3/8  BEN (MISCELLANEOUS)
CONN Y 3/8X3/8X3/8 BEN (MISCELLANEOUS) IMPLANT
CONT SPEC 4OZ CLIKSEAL STRL BL (MISCELLANEOUS) ×6 IMPLANT
CONT SPECI 4OZ STER CLIK (MISCELLANEOUS) ×27 IMPLANT
COVER SURGICAL LIGHT HANDLE (MISCELLANEOUS) ×3 IMPLANT
CUTTER ECHEON FLEX ENDO 45 340 (ENDOMECHANICALS) ×3 IMPLANT
DERMABOND ADVANCED (GAUZE/BANDAGES/DRESSINGS) ×2
DERMABOND ADVANCED .7 DNX12 (GAUZE/BANDAGES/DRESSINGS) ×1 IMPLANT
DRAIN CHANNEL 28F RND 3/8 FF (WOUND CARE) IMPLANT
DRAIN CHANNEL 32F RND 10.7 FF (WOUND CARE) IMPLANT
DRAPE LAPAROSCOPIC ABDOMINAL (DRAPES) ×3 IMPLANT
DRAPE SLUSH/WARMER DISC (DRAPES) ×3 IMPLANT
DRAPE WARM FLUID 44X44 (DRAPE) IMPLANT
DRSG ADAPTIC 3X8 NADH LF (GAUZE/BANDAGES/DRESSINGS) ×3 IMPLANT
ELECT BLADE 6.5 EXT (BLADE) ×3 IMPLANT
ELECT REM PT RETURN 9FT ADLT (ELECTROSURGICAL) ×3
ELECTRODE REM PT RTRN 9FT ADLT (ELECTROSURGICAL) ×1 IMPLANT
GAUZE SPONGE 4X4 12PLY STRL (GAUZE/BANDAGES/DRESSINGS) ×3 IMPLANT
GLOVE BIO SURGEON STRL SZ 6.5 (GLOVE) ×4 IMPLANT
GLOVE BIO SURGEON STRL SZ7 (GLOVE) ×6 IMPLANT
GLOVE BIO SURGEONS STRL SZ 6.5 (GLOVE) ×2
GLOVE BIOGEL PI IND STRL 7.0 (GLOVE) ×1 IMPLANT
GLOVE BIOGEL PI INDICATOR 7.0 (GLOVE) ×2
GLOVE SURG SIGNA 7.5 PF LTX (GLOVE) ×6 IMPLANT
GOWN STRL REUS W/ TWL LRG LVL3 (GOWN DISPOSABLE) ×3 IMPLANT
GOWN STRL REUS W/ TWL XL LVL3 (GOWN DISPOSABLE) ×1 IMPLANT
GOWN STRL REUS W/TWL LRG LVL3 (GOWN DISPOSABLE) ×6
GOWN STRL REUS W/TWL XL LVL3 (GOWN DISPOSABLE) ×2
HEMOSTAT SURGICEL 2X14 (HEMOSTASIS) IMPLANT
KIT BASIN OR (CUSTOM PROCEDURE TRAY) ×3 IMPLANT
KIT ROOM TURNOVER OR (KITS) ×3 IMPLANT
KIT SUCTION CATH 14FR (SUCTIONS) IMPLANT
NS IRRIG 1000ML POUR BTL (IV SOLUTION) ×6 IMPLANT
PACK CHEST (CUSTOM PROCEDURE TRAY) ×3 IMPLANT
PAD ARMBOARD 7.5X6 YLW CONV (MISCELLANEOUS) ×6 IMPLANT
POUCH ENDO CATCH II 15MM (MISCELLANEOUS) ×3 IMPLANT
POUCH SPECIMEN RETRIEVAL 10MM (ENDOMECHANICALS) IMPLANT
RELOAD 45 THICK GREEN (ENDOMECHANICALS) ×3 IMPLANT
SCISSORS ENDO CVD 5DCS (MISCELLANEOUS) IMPLANT
SEALANT PROGEL (MISCELLANEOUS) IMPLANT
SEALANT SURG COSEAL 4ML (VASCULAR PRODUCTS) IMPLANT
SEALANT SURG COSEAL 8ML (VASCULAR PRODUCTS) IMPLANT
SHEARS HARMONIC ACE PLUS 36CM (ENDOMECHANICALS) ×3 IMPLANT
SOLUTION ANTI FOG 6CC (MISCELLANEOUS) ×3 IMPLANT
SPECIMEN JAR MEDIUM (MISCELLANEOUS) IMPLANT
SPONGE GAUZE 4X4 12PLY STER LF (GAUZE/BANDAGES/DRESSINGS) ×3 IMPLANT
SPONGE INTESTINAL PEANUT (DISPOSABLE) ×36 IMPLANT
SPONGE TONSIL 1 RF SGL (DISPOSABLE) ×3 IMPLANT
STAPLE RELOAD 2.5MM WHITE (STAPLE) ×21 IMPLANT
STAPLE RELOAD 45MM GOLD (STAPLE) ×15 IMPLANT
STAPLER ENDO NO KNIFE (STAPLE) ×3 IMPLANT
STAPLER VASCULAR ECHELON 35 (CUTTER) ×3 IMPLANT
SUT PROLENE 4 0 RB 1 (SUTURE)
SUT PROLENE 4-0 RB1 .5 CRCL 36 (SUTURE) IMPLANT
SUT SILK  1 MH (SUTURE) ×4
SUT SILK 1 MH (SUTURE) ×2 IMPLANT
SUT SILK 1 TIES 10X30 (SUTURE) ×3 IMPLANT
SUT SILK 2 0 SH (SUTURE) IMPLANT
SUT SILK 2 0SH CR/8 30 (SUTURE) IMPLANT
SUT SILK 3 0 SH 30 (SUTURE) IMPLANT
SUT SILK 3 0 SH CR/8 (SUTURE) ×3 IMPLANT
SUT SILK 3 0SH CR/8 30 (SUTURE) IMPLANT
SUT VIC AB 1 CTX 36 (SUTURE) ×4
SUT VIC AB 1 CTX36XBRD ANBCTR (SUTURE) ×2 IMPLANT
SUT VIC AB 2-0 CTX 36 (SUTURE) ×6 IMPLANT
SUT VIC AB 2-0 UR6 27 (SUTURE) IMPLANT
SUT VIC AB 3-0 MH 27 (SUTURE) IMPLANT
SUT VIC AB 3-0 SH 27 (SUTURE) ×4
SUT VIC AB 3-0 SH 27X BRD (SUTURE) ×2 IMPLANT
SUT VIC AB 3-0 X1 27 (SUTURE) ×3 IMPLANT
SUT VICRYL 2 TP 1 (SUTURE) ×3 IMPLANT
SWAB COLLECTION DEVICE MRSA (MISCELLANEOUS) IMPLANT
SYRINGE 10CC LL (SYRINGE) ×3 IMPLANT
SYSTEM SAHARA CHEST DRAIN ATS (WOUND CARE) ×3 IMPLANT
TAPE CLOTH 4X10 WHT NS (GAUZE/BANDAGES/DRESSINGS) ×3 IMPLANT
TAPE CLOTH SURG 4X10 WHT LF (GAUZE/BANDAGES/DRESSINGS) ×3 IMPLANT
TIP APPLICATOR SPRAY EXTEND 16 (VASCULAR PRODUCTS) IMPLANT
TOWEL OR 17X24 6PK STRL BLUE (TOWEL DISPOSABLE) ×3 IMPLANT
TOWEL OR 17X26 10 PK STRL BLUE (TOWEL DISPOSABLE) ×3 IMPLANT
TRAP SPECIMEN MUCOUS 40CC (MISCELLANEOUS) IMPLANT
TRAY FOLEY CATH 16FRSI W/METER (SET/KITS/TRAYS/PACK) ×3 IMPLANT
TROCAR XCEL BLADELESS 5X75MML (TROCAR) ×3 IMPLANT
TUBE ANAEROBIC SPECIMEN COL (MISCELLANEOUS) IMPLANT
TUNNELER SHEATH ON-Q 11GX8 DSP (PAIN MANAGEMENT) IMPLANT
WATER STERILE IRR 1000ML POUR (IV SOLUTION) ×3 IMPLANT

## 2016-10-30 NOTE — Brief Op Note (Addendum)
10/30/2016  11:59 AM  PATIENT:  [name]  56 y.o. female  PRE-OPERATIVE DIAGNOSIS:  RUL ADENOCARCINOMA  POST-OPERATIVE DIAGNOSIS:  RUL ADENOCARCINOMA  PROCEDURE:  Procedure(s):  VIDEO ASSISTED THORACOSCOPY (VATS) -Right Upper Lobectomy -Mediastinal Lymph Node Dissection -Insertion of ON-Q Local Anesthetic Catheter  SURGEON:  Surgeon(s) and Role:    * Melrose Nakayama, MD - Primary  PHYSICIAN ASSISTANT: Ellwood Handler PA-C  ANESTHESIA:   general  EBL:  Total I/O In: 1000 [I.V.:1000] Out: 500 [Urine:400; Blood:100]  BLOOD ADMINISTERED:none  DRAINS: 28 Straight Chest Tbue   LOCAL MEDICATIONS USED:  MARCAINE     SPECIMEN:  Source of Specimen:  Right Upper Lobe, Lymph Nodes 4,7,10,11  DISPOSITION OF SPECIMEN:  PATHOLOGY  COUNTS:  YES  PLAN OF CARE: Admit to inpatient   PATIENT DISPOSITION:  ICU - extubated and stable.   Delay start of Pharmacological VTE agent (>24hrs) due to surgical blood loss or risk of bleeding: yes  Bronchial margins negative. Multiple enlarged but otherwise benign appearing lymph nodes

## 2016-10-30 NOTE — Anesthesia Procedure Notes (Addendum)
Procedure Name: Intubation Date/Time: 10/30/2016 8:40 AM Performed by: Salli Quarry Cyndra Feinberg Pre-anesthesia Checklist: Patient identified, Emergency Drugs available, Suction available and Patient being monitored Patient Re-evaluated:Patient Re-evaluated prior to inductionOxygen Delivery Method: Circle System Utilized Preoxygenation: Pre-oxygenation with 100% oxygen Intubation Type: IV induction Ventilation: Mask ventilation without difficulty Laryngoscope Size: Mac and 4 Grade View: Grade I Endobronchial tube: Left, Double lumen EBT, EBT position confirmed by auscultation and EBT position confirmed by fiberoptic bronchoscope and 39 Fr Number of attempts: 1 Airway Equipment and Method: Stylet Placement Confirmation: ETT inserted through vocal cords under direct vision,  positive ETCO2 and breath sounds checked- equal and bilateral (fiberoptic used) Secured at: 29 cm Tube secured with: Tape Dental Injury: Teeth and Oropharynx as per pre-operative assessment

## 2016-10-30 NOTE — Anesthesia Postprocedure Evaluation (Signed)
Anesthesia Post Note  Patient: [name]  Procedure(s) Performed: Procedure(s) (LRB): VIDEO ASSISTED THORACOSCOPY (VATS)/RIGHT UPPER LOBECTOMY (Right)  Patient location during evaluation: PACU Anesthesia Type: General Level of consciousness: awake and alert and patient cooperative Pain management: pain level controlled Vital Signs Assessment: post-procedure vital signs reviewed and stable Respiratory status: spontaneous breathing and respiratory function stable Cardiovascular status: stable Anesthetic complications: no       Last Vitals:  Vitals:   10/30/16 1645 10/30/16 1700  BP:    Pulse: 78 76  Resp: 10 11  Temp: 36.8 C     Last Pain:  Vitals:   10/30/16 1315  TempSrc:   PainSc: 2                  Dorsel Flinn S

## 2016-10-30 NOTE — Anesthesia Procedure Notes (Signed)
Central Venous Catheter Insertion Performed by: Marcie Bal Kjuan Seipp, anesthesiologist Start/End1/24/2018 7:42 AM, 10/30/2016 7:59 AM Patient location: Pre-op. Preanesthetic checklist: patient identified, IV checked, site marked, risks and benefits discussed, surgical consent, monitors and equipment checked, pre-op evaluation, timeout performed and anesthesia consent Position: Trendelenburg Lidocaine 1% used for infiltration and patient sedated Hand hygiene performed , maximum sterile barriers used  and Seldinger technique used Catheter size: 7 Fr Central line was placed.Double lumen Procedure performed using ultrasound guided technique. Ultrasound Notes:anatomy identified, needle tip was noted to be adjacent to the nerve/plexus identified and no ultrasound evidence of intravascular and/or intraneural injection Attempts: 1 Following insertion, line sutured, dressing applied and Biopatch. Post procedure assessment: blood return through all ports, free fluid flow and no air  Patient tolerated the procedure well with no immediate complications.

## 2016-10-30 NOTE — Anesthesia Preprocedure Evaluation (Signed)
Anesthesia Evaluation  Patient identified by MRN, date of birth, ID band Patient awake    Reviewed: Allergy & Precautions, H&P , NPO status , Patient's Chart, lab work & pertinent test results  Airway Mallampati: II   Neck ROM: full    Dental   Pulmonary former smoker,  RUL adenocarcinoma   breath sounds clear to auscultation       Cardiovascular hypertension,  Rhythm:regular Rate:Normal     Neuro/Psych    GI/Hepatic GERD  ,  Endo/Other  diabetes, Type 2Hypothyroidism obese  Renal/GU      Musculoskeletal  (+) Arthritis ,   Abdominal   Peds  Hematology   Anesthesia Other Findings   Reproductive/Obstetrics                             Anesthesia Physical Anesthesia Plan  ASA: III  Anesthesia Plan: General   Post-op Pain Management:    Induction: Intravenous  Airway Management Planned: Double Lumen EBT  Additional Equipment: Arterial line and CVP  Intra-op Plan:   Post-operative Plan:   Informed Consent: I have reviewed the patients History and Physical, chart, labs and discussed the procedure including the risks, benefits and alternatives for the proposed anesthesia with the patient or authorized representative who has indicated his/her understanding and acceptance.     Plan Discussed with: CRNA, Anesthesiologist and Surgeon  Anesthesia Plan Comments:         Anesthesia Quick Evaluation

## 2016-10-30 NOTE — Anesthesia Procedure Notes (Addendum)
Arterial Line Insertion Start/End1/24/2018 8:00 AM, 10/30/2016 8:05 AM Performed by: Marcie Bal, ADAM, WHITE, Antonino Nienhuis TENA Malva Diesing, CRNA  Patient location: Pre-op. Patient sedated Left, radial was placed Catheter size: 20 Fr Hand hygiene performed  and maximum sterile barriers used  Allen's test indicative of satisfactory collateral circulation Attempts: 1 Procedure performed without using ultrasound guided technique. Following insertion, Biopatch and dressing applied. Post procedure assessment: normal and unchanged  Patient tolerated the procedure well with no immediate complications.

## 2016-10-30 NOTE — Transfer of Care (Signed)
Immediate Anesthesia Transfer of Care Note  Patient: [name]  Procedure(s) Performed: Procedure(s): VIDEO ASSISTED THORACOSCOPY (VATS)/RIGHT UPPER LOBECTOMY (Right)  Patient Location: PACU  Anesthesia Type:General  Level of Consciousness: awake, alert  and patient cooperative  Airway & Oxygen Therapy: Patient Spontanous Breathing and Patient connected to nasal cannula oxygen  Post-op Assessment: Report given to RN and Post -op Vital signs reviewed and stable  Post vital signs: Reviewed and stable  Last Vitals:  Vitals:   10/30/16 0648  BP: (!) 142/71  Pulse: 74  Resp: 18  Temp: 36.6 C    Last Pain:  Vitals:   10/30/16 0648  TempSrc: Oral      Patients Stated Pain Goal: 2 (28/11/88 6773)  Complications: No apparent anesthesia complications

## 2016-10-30 NOTE — Interval H&P Note (Signed)
History and Physical Interval Note:  10/30/2016 8:12 AM  [name]  has presented today for surgery, with the diagnosis of RUL ADENOCARCINOMA  The various methods of treatment have been discussed with the patient and family. After consideration of risks, benefits and other options for treatment, the patient has consented to  Procedure(s): VIDEO ASSISTED THORACOSCOPY (VATS)/RIGHT UPPER LOBECTOMY (Right) as a surgical intervention .  The patient's history has been reviewed, patient examined, no change in status, stable for surgery.  I have reviewed the patient's chart and labs.  Questions were answered to the patient's satisfaction.     Melrose Nakayama

## 2016-10-30 NOTE — H&P (View-Only) (Signed)
PCP is Ailene Rud, NP Referring Provider is No ref. provider found  Chief Complaint  Patient presents with  . Lung Lesion    RUL..CT LUNG BX 09/03/16, CT CHEST 07/15/16, PET 08/21/16, CT BRAIN 09/11/16, PFT  . Lung Cancer    ADENOCARCINOMA    HPI: [name] is sent for consultation regarding a right upper lobe lung cancer.  [name] is a 56 year old woman with a history of tobacco abuse (up to 2 packs per day for 44 years, currently less than 1 pack per day). She also has a history of hypertension, hyperlipidemia, type 2 diabetes, arthritis, cervical cancer and "thyroid disease."  She take 2-1/2 month history of a persistent cough. A CT chest showed a 2.6 cm mixed solid and groundglass nodule in the right upper lobe. The solid component was 1.3 cm. She also had a 9 mm groundglass nodule in the left upper lobe. There was a 5 mm subtle groundglass nodule in the superior segment of the right lower lobe. She had a PET/CT which showed this was hypermetabolic with an SUV of 3.9. There was no suspicious adenopathy is no evidence of regional or distant metastases. A CT-guided biopsy was done on 09/03/2016. It showed adenocarcinoma consistent with lung primary.  Her cough has improved, and she now has an occasional dry cough. She denies any chest pain, pressure, or tightness with exertion. She denies any shortness of breath other than with heavy exertion. She has not had any wheezing. She denies any headaches or visual changes. She has not had any change in appetite or weight loss. She does have arthritis particularly in her right knee, but does not had any new bone or joint pain.  Zubrod Score: At the time of surgery this patient's most appropriate activity status/level should be described as: '[x]'$     0    Normal activity, no symptoms '[]'$     1    Restricted in physical strenuous activity but ambulatory, able to do out light work '[]'$     2    Ambulatory and capable of self care, unable to do work  activities, up and about >50 % of waking hours                              '[]'$     3    Only limited self care, in bed greater than 50% of waking hours '[]'$     4    Completely disabled, no self care, confined to bed or chair '[]'$     5    Moribund    Past Medical History:  Diagnosis Date  . Cancer [facility])    h/o cervical  . Cancer ([place])    RULobe of the lung.Marland Kitchenadenocarcinoma per CT BX 09/03/16  . Diabetes mellitus without complication ([place])   . Ectopic pregnancy   . GERD (gastroesophageal reflux disease)   . Hyperlipidemia   . Hypertension   . Thyroid disease    hypothyroidism    Past Surgical History:  Procedure Laterality Date  . dilatation and curettage    . ENDOMETRIAL ABLATION    . resection of the left oviduct and ovary      Family History  Problem Relation Age of Onset  . Heart disease Mother   . Heart disease Father   . Diabetes Sister   . Hypertension Sister   . Cancer Maternal Aunt     BREAST CANCER    Social History Social History  Substance Use Topics  . Smoking status: Current Every Day Smoker    Packs/day: 2.00    Years: 44.00  . Smokeless tobacco: Never Used     Comment: BARELY A  PACK  . Alcohol use No    Current Outpatient Prescriptions  Medication Sig Dispense Refill  . amoxicillin (AMOXIL) 875 MG tablet Take 875 mg by mouth 2 (two) times daily.    Marland Kitchen aspirin EC 81 MG tablet Take 81 mg by mouth daily.    Marland Kitchen desloratadine (CLARINEX) 5 MG tablet Take 5 mg by mouth daily.    . ergocalciferol (VITAMIN D2) 50000 units capsule Take 50,000 Units by mouth once a week. X 8 weeks    . Ferrous Fumarate 29 MG TABS Take 1 tablet by mouth daily.    . Garlic 789 MG CAPS Take 1 capsule by mouth daily.    Marland Kitchen gemfibrozil (LOPID) 600 MG tablet Take 600 mg by mouth 2 (two) times daily before a meal.    . insulin glargine (LANTUS) 100 UNIT/ML injection Inject 20 Units into the skin 2 (two) times daily.     Marland Kitchen levothyroxine (SYNTHROID, LEVOTHROID) 50 MCG tablet Take 50 mcg  by mouth daily before breakfast.    . lisinopril (PRINIVIL,ZESTRIL) 20 MG tablet Take 20 mg by mouth daily.    . meclizine (ANTIVERT) 25 MG tablet Take 25 mg by mouth 3 (three) times daily as needed for dizziness.    . metFORMIN (GLUCOPHAGE) 1000 MG tablet Take 1,000 mg by mouth 2 (two) times daily with a meal.    . Omega-3 Fatty Acids (FISH OIL) 1000 MG CAPS Take 1 capsule by mouth daily.    Marland Kitchen omeprazole (PRILOSEC) 20 MG capsule Take 20 mg by mouth 2 (two) times daily before a meal.    . simvastatin (ZOCOR) 80 MG tablet Take 80 mg by mouth daily.    Marland Kitchen triamterene-hydrochlorothiazide (MAXZIDE-25) 37.5-25 MG tablet Take 1 tablet by mouth daily.     No current facility-administered medications for this visit.     No Known Allergies  Review of Systems  Constitutional: Negative for activity change, appetite change, chills, fever and unexpected weight change.  HENT: Positive for dental problem. Negative for trouble swallowing and voice change.   Eyes: Negative for visual disturbance.  Respiratory: Positive for cough. Negative for shortness of breath and wheezing.   Cardiovascular: Positive for leg swelling. Negative for chest pain and palpitations.  Gastrointestinal: Positive for abdominal pain. Negative for blood in stool.       Gall stones  Genitourinary: Positive for frequency. Negative for dysuria and hematuria.  Musculoskeletal: Positive for arthralgias and joint swelling.  Neurological: Negative for speech difficulty, weakness and headaches.  Hematological: Negative for adenopathy. Bruises/bleeds easily.  All other systems reviewed and are negative.   BP 114/66 (BP Location: Right Arm, Patient Position: Sitting, Cuff Size: Large)   Pulse 92   Resp 16   Ht '6\' 1"'$  (1.854 m)   Wt 260 lb (117.9 kg)   SpO2 97% Comment: ON RA  BMI 34.30 kg/m  Physical Exam  Constitutional: She is oriented to person, place, and time.  obese  HENT:  Head: Normocephalic and atraumatic.  Mouth/Throat:  No oropharyngeal exudate.  Eyes: Conjunctivae and EOM are normal. Pupils are equal, round, and reactive to light. No scleral icterus.  Neck: Neck supple. No thyromegaly present.  Cardiovascular: Normal rate, regular rhythm, normal heart sounds and intact distal pulses.  Exam reveals no gallop and no friction rub.  No murmur heard. Pulmonary/Chest: Effort normal and breath sounds normal. No respiratory distress. She has no wheezes. She has no rales.  Abdominal: Soft. She exhibits no distension. There is no tenderness.  Musculoskeletal: She exhibits no edema.  Lymphadenopathy:    She has no cervical adenopathy.  Neurological: She is alert and oriented to person, place, and time. No cranial nerve deficit.  Motor 5/5 bilaterally  Skin: Skin is warm and dry.  Vitals reviewed.    Diagnostic Tests: I personally reviewed the images from her PET/CT and CT guided biopsy that were done at [facility].  Official report on PET CT Impression 1. Malignant range FDG uptake is associated with the right upper lobe some solid nodule. This is suspicious for primary pulmonary neoplasm, favor adenocarcinoma. No hypermetabolic hilar or mediastinal lymph nodes and no evidence for distant metastatic disease. 2. Aortic atherosclerosis 3. Gallstones  Biopsy positive for adenocarcinoma  Pulmonary function testing FVC   3.05 (90%) FEV1  2.49 (91%) FEV1  2.52 (92%) post bronchodilator VC  3.05 (90%) DLCO 18.36 (73%)  Impression: 56 year old woman with history of tobacco abuse who has a mixed solid and sub solid nodule in the right upper lobe. The solid component measures 1.3 cm and the overall nodule is about 2.5 cm. Biopsy proven adenocarcinoma.  I recommended that we proceed with right VATS for a right upper lobectomy for definitive treatment of the invasive adenocarcinoma. I have discussed the general nature of the procedure, the need for general anesthesia, and the incisions to be used with Ms.  Rojas. We discussed the expected hospital stay, overall recovery and short and long term outcomes. I informed her of the indications, risks, benefits, and alternatives. She typically had questions regarding stereotactic radiation. She understand the risks of surgery include, but are not limited to death, stroke, MI, DVT/PE, bleeding, possible need for transfusion, infections, cardiac arrhythmias, as well as other organ system dysfunction including respiratory, renal, or GI complications.   She accepts the risk and agrees to proceed  Hypertension-blood pressure normal today on Maxide  Hyperlipidemia- currently being treated with Zocor and Lopid  Type 2 diabetes- on metformin and insulin  Plan: Right VATS, right upper lobectomy on Thursday, 10/26/2016  Melrose Nakayama, MD [facility] [phone]

## 2016-10-31 ENCOUNTER — Inpatient Hospital Stay (HOSPITAL_COMMUNITY): Payer: BLUE CROSS/BLUE SHIELD

## 2016-10-31 ENCOUNTER — Encounter (HOSPITAL_COMMUNITY): Payer: Self-pay | Admitting: Thoracic Surgery (Cardiothoracic Vascular Surgery)

## 2016-10-31 LAB — BASIC METABOLIC PANEL
ANION GAP: 8 (ref 5–15)
BUN: 21 mg/dL — ABNORMAL HIGH (ref 6–20)
CHLORIDE: 104 mmol/L (ref 101–111)
CO2: 24 mmol/L (ref 22–32)
Calcium: 8.7 mg/dL — ABNORMAL LOW (ref 8.9–10.3)
Creatinine, Ser: 0.94 mg/dL (ref 0.44–1.00)
GFR calc non Af Amer: >60 mL/min (ref >60)
Glucose, Bld: 137 mg/dL — ABNORMAL HIGH (ref 65–99)
Potassium: 4.2 mmol/L (ref 3.5–5.1)
Sodium: 136 mmol/L (ref 135–145)

## 2016-10-31 LAB — GLUCOSE, CAPILLARY
GLUCOSE-CAPILLARY: 132 mg/dL — AB (ref 65–99)
GLUCOSE-CAPILLARY: 142 mg/dL — AB (ref 65–99)
GLUCOSE-CAPILLARY: 163 mg/dL — AB (ref 65–99)
Glucose-Capillary: 142 mg/dL — ABNORMAL HIGH (ref 65–99)
Glucose-Capillary: 155 mg/dL — ABNORMAL HIGH (ref 65–99)

## 2016-10-31 LAB — BLOOD GAS, ARTERIAL
Acid-Base Excess: 0.2 mmol/L (ref 0.0–2.0)
Bicarbonate: 24.8 mmol/L (ref 20.0–28.0)
Drawn by: 252031
O2 Content: 2 L/min
O2 Saturation: 97.7 %
PCO2 ART: 43.8 mmHg (ref 32.0–48.0)
PH ART: 7.371 (ref 7.350–7.450)
Patient temperature: 98.6
pO2, Arterial: 98 mmHg (ref 83.0–108.0)

## 2016-10-31 LAB — CBC
HEMATOCRIT: 30.4 % — AB (ref 36.0–46.0)
HEMOGLOBIN: 10 g/dL — AB (ref 12.0–15.0)
MCH: 30.5 pg (ref 26.0–34.0)
MCHC: 32.9 g/dL (ref 30.0–36.0)
MCV: 92.7 fL (ref 78.0–100.0)
Platelets: 235 10*3/uL (ref 150–400)
RBC: 3.28 MIL/uL — AB (ref 3.87–5.11)
RDW: 12.7 % (ref 11.5–15.5)
WBC: 13.4 10*3/uL — ABNORMAL HIGH (ref 4.0–10.5)

## 2016-10-31 MED ORDER — NICOTINE 21 MG/24HR TD PT24
21.0000 mg | MEDICATED_PATCH | Freq: Every day | TRANSDERMAL | Status: DC
  Administered 2016-10-31 – 2016-11-05 (×6): 21 mg via TRANSDERMAL
  Filled 2016-10-31 (×6): qty 1

## 2016-10-31 MED ORDER — INSULIN ASPART 100 UNIT/ML ~~LOC~~ SOLN: 0.0000 [IU] | Freq: Every day | SUBCUTANEOUS | Status: DC

## 2016-10-31 MED ORDER — MECLIZINE HCL 25 MG PO TABS
25.0000 mg | ORAL_TABLET | Freq: Three times a day (TID) | ORAL | Status: DC | PRN
  Filled 2016-10-31: qty 1

## 2016-10-31 MED ORDER — INSULIN GLARGINE 100 UNIT/ML ~~LOC~~ SOLN
20.0000 [IU] | Freq: Every day | SUBCUTANEOUS | Status: DC
  Administered 2016-10-31 – 2016-11-05 (×6): 20 [IU] via SUBCUTANEOUS
  Filled 2016-10-31 (×6): qty 0.2

## 2016-10-31 MED ORDER — INSULIN ASPART 100 UNIT/ML ~~LOC~~ SOLN
0.0000 [IU] | Freq: Three times a day (TID) | SUBCUTANEOUS | Status: DC
  Administered 2016-10-31: 4 [IU] via SUBCUTANEOUS
  Administered 2016-10-31 (×2): 2 [IU] via SUBCUTANEOUS
  Administered 2016-11-01: 3 [IU] via SUBCUTANEOUS
  Administered 2016-11-01: 4 [IU] via SUBCUTANEOUS
  Administered 2016-11-01 – 2016-11-02 (×2): 3 [IU] via SUBCUTANEOUS
  Administered 2016-11-02: 4 [IU] via SUBCUTANEOUS
  Administered 2016-11-03: 3 [IU] via SUBCUTANEOUS
  Administered 2016-11-03: 4 [IU] via SUBCUTANEOUS
  Administered 2016-11-03: 3 [IU] via SUBCUTANEOUS
  Administered 2016-11-04: 4 [IU] via SUBCUTANEOUS
  Administered 2016-11-04: 3 [IU] via SUBCUTANEOUS
  Administered 2016-11-05: 4 [IU] via SUBCUTANEOUS

## 2016-10-31 NOTE — Op Note (Signed)
[name], [name]              ACCOUNT NO.:  [id number]  MEDICAL RECORD NO.:  [id number]  LOCATION:                                 FACILITY:  PHYSICIAN:  Revonda Standard. Roxan Hockey, M.D. DATE OF BIRTH:  DATE OF PROCEDURE:  10/30/2016 DATE OF DISCHARGE:                              OPERATIVE REPORT   PREOPERATIVE DIAGNOSIS:  Adenocarcinoma of the right upper lobe.  POSTOPERATIVE DIAGNOSIS:  Adenocarcinoma of the right upper lobe.  PROCEDURE:   Right video-assisted thoracoscopy, Thoracoscopic right upper lobectomy, Mediastinal lymph node dissection,  On-Q local anesthetic catheter placement.  ASSISTANT:  Ellwood Handler, PA.  ANESTHESIA:  General.  FINDINGS:  Right upper lobe nodule palpable.  Frozen showed well differentiated adenocarcinoma, bronchial margin negative.  Multiple enlarged, but otherwise benign-appearing lymph nodes.  No palpable nodule in the superior segment of the right lower lobe.  CLINICAL NOTE:  [name] is a 56 year old woman with a history of tobacco abuse who presented with a 2-1/42-monthhistory of a persistent cough.  A CT of the chest showed a 2.6 cm mixed solid and sub-solid right upper lobe nodule.  The solid component measured 1.3 cm.  She had a PET-CT, which showed the lesion was hypermetabolic with an SUV of 3.9. A CT-guided biopsy showed adenocarcinoma consistent with a lung primary. She was advised to undergo surgical resection for definitive treatment, and she was offered the option of radiation should she not wish to have surgery.  The indications, risks, benefits, and alternatives were discussed in detail with the patient.  She understood and accepted the risks and agreed to proceed.  OPERATIVE NOTE:  Ms. RYinglingwas brought to the preoperative holding area on October 30, 2016.  Anesthesia placed a central line and arterial blood pressure monitoring line.  She was taken to the operating room, anesthetized, and intubated with a  double-lumen endotracheal tube. Intravenous antibiotics were administered.  A Foley catheter was placed. Sequential compression devices were placed on the calves for DVT prophylaxis.  She was placed in a left lateral decubitus position, and the right chest was prepped and draped in the usual sterile fashion. Single lung ventilation of the left lung was initiated, and was tolerated well throughout the procedure.  An incision was made in the seventh intercostal space in the midaxillary line. A 5 mm port was inserted, and the thoracoscope was advanced into the chest.  There was good isolation of the right lung, but it was relatively slow to deflate.  An incision was made in the fourth interspace anterolaterally.  No rib spreading was performed during the procedure.  There were some adhesions in the minor fissure.  These were taken down.  The minor fissure was complete for the most part except anteriorly and a small area overlying the pulmonary artery.  The major fissure was nearly complete as well.  The inferior ligament was divided with the Harmonic Scalpel.  No significant nodal tissue was encountered in the inferior ligament.  The pleural reflection was divided at the hilum anteriorly and posteriorly.  Posteriorly, there were multiple enlarged but otherwise benign-appearing subcarinal nodes.  These were taken as separate specimens, as were all lymph nodes that were encountered  during the dissection.  They were sent for permanent pathology. Attention then was turned back to the confluence of the major and minor fissures.  The fissure was completed in this area.  The pleura was incised.  The pulmonary artery was identified.  The posterior ascending branch was identified and dissected free of surrounding attachments. Attention then was turned back anteriorly.  The right middle lobe vein came into the superior pulmonary vein relatively distally.  The right upper lobe pulmonary veins were  dissected out separately, and the more anterior 2 branches were divided with a vascular stapler.  This allowed exposure of the other more anterior branch to the upper lobe from the main pulmonary artery, and the anterior and apical branches were also identified, and they were in normal location.  The minor fissure was completed with sequential firings of the endoscopic GIA stapler with gold cartridges.  This was an [employer].  The posterior ascending branch now was easily encircled and divided with a stapler as was the more proximal anterior branch.  The anterior and apical branches were divided separately with a vascular stapler as well.  There was a large remaining vein branch in the right upper lobe that was encircled and divided.  This allowed exposure of the bronchus.  There were multiple enlarged lymph nodes along the bronchus, which were removed and sent as separate specimens. The Echelon stapler with a green cartridge then was placed across the bronchus at its origin.  This also encompassed the small portion of the major fissure, which was not complete.  The stapler was closed.  A test inflation showed good aeration of the lower and middle lobes.  The stapler was fired transecting the right upper lobe bronchus.  The right upper lobe was placed into an endoscopic retrieval bag, removed, and sent for frozen section of the bronchial margin, which returned free of tumor.  The superior segment was inspected.  There was no palpable nodule within it.  Decision was made not to attempt to remove the 5 mm ground-glass opacity as I could not be positively identify exactly where the nodule was.  The right middle lobe was tacked to the right lower lobe with a 3-0 Vicryl figure-of-eight suture, and then the non-cutting stapler was used to connect the two as well.  An On-Q local anesthetic catheter was placed through a separate stab incision posteriorly and tunneled into a  subpleural location.  It was primed with 5 mL of 0.5% Marcaine and secured this to the skin with a 3-0 silk suture.  The level 4R lymph nodes then were identified and removed, working underneath the azygos vein.  Again, these were sent as separate specimens.  The chest was copiously irrigated with warm saline.  A test inflation to 30 cm of water revealed no leakage from the bronchial stump.  A 28-French chest tube was placed through the original port incision and directed to the apex.  It was secured to the skin with a 0 silk suture.  The lower and middle lobes were reinflated, and there was good aeration of both.  The working incision was closed with a #1 Vicryl fascial suture, 2-0 Vicryl subcutaneous suture, and a 3-0 Vicryl subcuticular suture.  All sponge, needle, and instrument counts were correct at the end of the procedure.  The patient was taken from the operating room to the postanesthetic care unit, extubated, and in good condition.     Revonda Standard Roxan Hockey, M.D.     SCH/MEDQ  D:  10/30/2016  T:  10/31/2016  Job:  [id number]

## 2016-10-31 NOTE — Progress Notes (Signed)
Pt arrived from [facility]. Pt VSS. Family at bedside. Oriented to unit and routine. Cmt notified of new patient.

## 2016-10-31 NOTE — Progress Notes (Signed)
1 Day Post-Op Procedure(s) (LRB): VIDEO ASSISTED THORACOSCOPY (VATS)/RIGHT UPPER LOBECTOMY (Right) Subjective: "sore"  Objective: Vital signs in last 24 hours: Temp:  [97.5 F (36.4 C)-98.4 F (36.9 C)] 98.4 F (36.9 C) (01/25 0400) Pulse Rate:  [67-83] 71 (01/25 0700) Cardiac Rhythm: Normal sinus rhythm (01/25 0400) Resp:  [8-24] 16 (01/25 0700) BP: (88-135)/(42-73) 119/63 (01/25 0700) SpO2:  [92 %-100 %] 95 % (01/25 0700) Arterial Line BP: (95-161)/(47-73) 140/56 (01/25 0700) Weight:  [266 lb 1.5 oz (120.7 kg)] 266 lb 1.5 oz (120.7 kg) (01/24 1730)  Hemodynamic parameters for last 24 hours:    Intake/Output from previous day: 01/24 0701 - 01/25 0700 In: 4760 [P.O.:1560; I.V.:2700; IV Piggyback:100] Out: 3453 [Urine:2935; Blood:100; Chest Tube:418] Intake/Output this shift: No intake/output data recorded.  General appearance: alert, cooperative and no distress Neurologic: intact Heart: regular rate and rhythm Lungs: diminished breath sounds bibasilar Abdomen: normal findings: soft, non-tender np air leak  Lab Results:  Recent Labs  10/28/16 1545 10/31/16 0330  WBC 7.9 13.4*  HGB 11.8* 10.0*  HCT 35.6* 30.4*  PLT 256 235   BMET:  Recent Labs  10/28/16 1545 10/31/16 0330  NA 141 136  K 4.1 4.2  CL 109 104  CO2 22 24  GLUCOSE 166* 137*  BUN 19 21*  CREATININE 1.22* 0.94  CALCIUM 9.1 8.7*    PT/INR:  Recent Labs  10/28/16 1545  LABPROT 13.1  INR 0.99   ABG    Component Value Date/Time   PHART 7.371 10/31/2016 0257   HCO3 24.8 10/31/2016 0257   ACIDBASEDEF 1.0 10/28/2016 1624   O2SAT 97.7 10/31/2016 0257   CBG (last 3)   Recent Labs  10/30/16 1918 10/30/16 2341 10/31/16 0350  GLUCAP 189* 179* 132*    Assessment/Plan: S/P Procedure(s) (LRB): VIDEO ASSISTED THORACOSCOPY (VATS)/RIGHT UPPER LOBECTOMY (Right) Plan for transfer to step-down: see transfer orders  Doing well POD # 1  CV- stable- dc X line  RESP- s/p lobectomy- IS,  pulmonary hygiene  RENAL- creatinine and lytes OK  ENDO- CBG elevated  Anemia secondaryt o ANL- mild, follow  SCD + enoxaparin for DVT prophylaxis  LOS: 1 day    Melrose Nakayama 10/31/2016

## 2016-11-01 ENCOUNTER — Inpatient Hospital Stay (HOSPITAL_COMMUNITY): Payer: BLUE CROSS/BLUE SHIELD

## 2016-11-01 LAB — GLUCOSE, CAPILLARY
GLUCOSE-CAPILLARY: 123 mg/dL — AB (ref 65–99)
GLUCOSE-CAPILLARY: 162 mg/dL — AB (ref 65–99)
GLUCOSE-CAPILLARY: 162 mg/dL — AB (ref 65–99)
Glucose-Capillary: 143 mg/dL — ABNORMAL HIGH (ref 65–99)

## 2016-11-01 LAB — COMPREHENSIVE METABOLIC PANEL
ALBUMIN: 3.2 g/dL — AB (ref 3.5–5.0)
ALK PHOS: 50 U/L (ref 38–126)
ALT: 18 U/L (ref 14–54)
ANION GAP: 7 (ref 5–15)
AST: 24 U/L (ref 15–41)
BILIRUBIN TOTAL: 0.5 mg/dL (ref 0.3–1.2)
BUN: 19 mg/dL (ref 6–20)
CALCIUM: 9 mg/dL (ref 8.9–10.3)
CO2: 27 mmol/L (ref 22–32)
Chloride: 99 mmol/L — ABNORMAL LOW (ref 101–111)
Creatinine, Ser: 1.01 mg/dL — ABNORMAL HIGH (ref 0.44–1.00)
GFR calc Af Amer: >60 mL/min (ref >60)
GFR calc non Af Amer: >60 mL/min (ref >60)
GLUCOSE: 154 mg/dL — AB (ref 65–99)
Potassium: 4 mmol/L (ref 3.5–5.1)
Sodium: 133 mmol/L — ABNORMAL LOW (ref 135–145)
TOTAL PROTEIN: 6.2 g/dL — AB (ref 6.5–8.1)

## 2016-11-01 LAB — CBC
HEMATOCRIT: 28.5 % — AB (ref 36.0–46.0)
HEMOGLOBIN: 9.5 g/dL — AB (ref 12.0–15.0)
MCH: 30.9 pg (ref 26.0–34.0)
MCHC: 33.3 g/dL (ref 30.0–36.0)
MCV: 92.8 fL (ref 78.0–100.0)
Platelets: 224 10*3/uL (ref 150–400)
RBC: 3.07 MIL/uL — ABNORMAL LOW (ref 3.87–5.11)
RDW: 12.9 % (ref 11.5–15.5)
WBC: 10.5 10*3/uL (ref 4.0–10.5)

## 2016-11-01 NOTE — Progress Notes (Signed)
2 Days Post-Op Procedure(s) (LRB): VIDEO ASSISTED THORACOSCOPY (VATS)/RIGHT UPPER LOBECTOMY (Right) Subjective: Feels better this morning Small amounts of blood in sputum  Objective: Vital signs in last 24 hours: Temp:  [97.9 F (36.6 C)-98.3 F (36.8 C)] 98.3 F (36.8 C) (01/26 0757) Pulse Rate:  [58-80] 74 (01/26 0757) Cardiac Rhythm: Normal sinus rhythm (01/26 0742) Resp:  [13-27] 17 (01/26 0757) BP: (100-125)/(43-85) 100/57 (01/26 0757) SpO2:  [92 %-98 %] 97 % (01/26 0757)  Hemodynamic parameters for last 24 hours:    Intake/Output from previous day: 01/25 0701 - 01/26 0700 In: 1909 [P.O.:1440; I.V.:469] Out: 2935 [Urine:2785; Chest Tube:150] Intake/Output this shift: No intake/output data recorded.  General appearance: alert, cooperative and no distress Neurologic: intact Heart: regular rate and rhythm Lungs: clear to auscultation bilaterally Abdomen: normal findings: soft, non-tender no air leak  Lab Results:  Recent Labs  10/31/16 0330 11/01/16 0431  WBC 13.4* 10.5  HGB 10.0* 9.5*  HCT 30.4* 28.5*  PLT 235 224   BMET:  Recent Labs  10/31/16 0330 11/01/16 0431  NA 136 133*  K 4.2 4.0  CL 104 99*  CO2 24 27  GLUCOSE 137* 154*  BUN 21* 19  CREATININE 0.94 1.01*  CALCIUM 8.7* 9.0    PT/INR: No results for input(s): LABPROT, INR in the last 72 hours. ABG    Component Value Date/Time   PHART 7.371 10/31/2016 0257   HCO3 24.8 10/31/2016 0257   ACIDBASEDEF 1.0 10/28/2016 1624   O2SAT 97.7 10/31/2016 0257   CBG (last 3)   Recent Labs  10/31/16 1744 10/31/16 2118 11/01/16 0759  GLUCAP 155* 163* 143*    Assessment/Plan: S/P Procedure(s) (LRB): VIDEO ASSISTED THORACOSCOPY (VATS)/RIGHT UPPER LOBECTOMY (Right) POD # 2  CV- stable  RESP- CXR this AM shows a small space at right apex- space v pneumothorax- she does not have an air leak and has tidal variation in chest tube so I suspect it is just a space but was not visible on CXR yesterday.  Will leave CT in place on water seal today. If no air leak and CXr stable dc tube tomorrow  RENAL- creatinine and lytes OK  Foley out  ENDO- CBG mildly elevated continue SSI  SCD + enoxaparin for DVT prophylaxis  Continue ambulation  Path T1N0 stage IA- informed patient   LOS: 2 days    [name] 11/01/2016

## 2016-11-01 NOTE — Discharge Instructions (Signed)
Video-Assisted Thoracic Surgery, Care After Refer to this sheet in the next few weeks. These instructions provide you with information on caring for yourself after your procedure. Your caregiver may also give you more specific instructions. Your procedure has been planned according to current medical practices, but problems sometimes occur. Call your caregiver if you have any problems or questions after your procedure. HOME CARE INSTRUCTIONS   Only take over-the-counter or prescription medications as directed.  Only take pain medications (narcotics) as directed.  Do not drive until your caregiver approves. Driving while taking narcotics or soon after surgery can be dangerous, so discuss the specific timing with your caregiver.  Avoid activities that use your chest muscles, such as lifting heavy objects, for at least 3-4 weeks.   Take deep breaths to expand the lungs and to protect against pneumonia.  Do breathing exercises as directed by your caregiver. If you were given an incentive spirometer to help with breathing, use it as directed.  You may resume a normal diet and activities when you feel you are able to or as directed.  Do not take a bath until your caregiver says it is OK. Use the shower instead.   Keep the bandage (dressing) covering the area where the chest tube was inserted (incision site) dry for 48 hours. After 48 hours, remove the dressing unless there is new drainage.  Remove dressings as directed by your caregiver.  Change dressings if necessary or as directed.  Keep all follow-up appointments. It is important for you to see your caregiver after surgery to discuss appropriate follow-up care and surveillance, if it is necessary. SEEK MEDICAL CARE:  You feel excessive or increasing pain at an incision site.  You notice bleeding, skin irritation, drainage, swelling, or redness at an incision site.  There is a bad smell coming from an incision or dressing.  It feels  like your heart is fluttering or beating rapidly.  Your pain medication does not relieve your pain. SEEK IMMEDIATE MEDICAL CARE IF:   You have a fever.   You have chest pain.  You have a rash.  You have shortness of breath.  You have trouble breathing.   You feel weak, lightheaded, dizzy, or faint.  MAKE SURE YOU:   Understand these instructions.   Will watch your condition.   Will get help right away if you are not doing well or get worse. This information is not intended to replace advice given to you by your health care provider. Make sure you discuss any questions you have with your health care provider. Document Released: 01/18/2013 Document Revised: 10/14/2014 Document Reviewed: 01/18/2013 Elsevier Interactive Patient Education  2017 [employer].

## 2016-11-01 NOTE — Discharge Summary (Signed)
Physician Discharge Summary  Patient ID: [name] MRN: [id number] DOB/AGE: Nov 26, 1960 56 y.o.  Admit date: 10/30/2016 Discharge date: 11/05/2016  Admission Diagnoses:  Patient Active Problem List   Diagnosis Date Noted  . Hypertension   . Hyperlipidemia   . Thyroid disease   . Diabetes mellitus without complication ([place])   . GERD (gastroesophageal reflux disease)   . Cancer ([place])   . Ectopic pregnancy    Discharge Diagnoses:   Patient Active Problem List   Diagnosis Date Noted  . S/P lobectomy of lung 10/30/2016  . Hypertension   . Hyperlipidemia   . Thyroid disease   . Diabetes mellitus without complication ([place])   . GERD (gastroesophageal reflux disease)   . Cancer ([place])   . Ectopic pregnancy   Acute blood loss anemia  Discharged Condition: good  History of Present Illness:  Ms. [name] is a 56 yo obese female with history of tobacco abuse, HTN, Hyperlipidemia, Type 2 DM, Cervical cancer, and Thyroid disease.  The patient developed a 2-2 1/2 month history of cough.  She underwent CT scan which showed a 2.6 cm mixed solid and ground glass nodule in the right upper lobe.  The solid component measured 1.3 cm.  There was also a 9 mm ground glass nodule in the left upper lobe.  There was also a 5 mm subtle ground glass nodule int he superior segment of the right lower lobe.  She had a PET/CT scan which showed the lesion to be hypermetabolic which was suspicious for malignancy.  She underwent a CT guided biopsy on 09/03/2016 which pathology reported to be adenocarcinoma.  She was referred to [facility] for surgical evaluation.  She was evaluated by Dr. Roxan Rojas initially on 10/15/2016 at which time he recommended with Right VATS with right upper lobectomy.  The risks and benefits of the procedure were explained to the patient and she was agreeable to proceed.      Hospital Course:   Mrs. [name] presented to [facility] on 10/30/2016.  She was taken to the operating room  and underwent Right VATS with Right Upper Lobectomy, Lymph node dissection, and placement of ON-Q anesthetic catheter.  She tolerated the procedure without difficulty, was extubated, and taken to the PACU in stable condition.  The patient has done well post operatively.  His chest tube has been free from air leak and was transitioned to water seal POD #2.  Follow up CXR showed small stable air space.  She is ambulating without difficulty.  Her pain is well controlled.  She is tolerating a diet.  She is felt medically stable for discharge home today.     Significant Diagnostic Studies:   Pathology: 1. Lung, resection (segmental or lobe), Right upper lobe WELL DIFFERENTIATED ADENOCARCINOMA OF THE LUNG, GRADE 1 (2.5 CM) THE TUMOR IS CONFINED TO THE LUNG (PT1B) BRONCHIAL, VASCULAR AND ALL RESECTION MARGINS ARE NEGATIVE FOR CARCINOMA 2. Lymph node, biopsy, L 7 #1 ONE BENIGN LYMPH NODE (0/1) 3. Lymph node, biopsy, L 7 #2 ONE BENIGN LYMPH NODE (0/1) 4. Lymph node, biopsy, L 7 #3 ONE BENIGN LYMPH NODE (0/1) 5. Lymph node, biopsy, L 12 ONE BENIGN LYMPH NODE (0/1) 6. Lymph node, biopsy, L 12 #2 ONE BENIGN LYMPH NODE (0/1) 7. Lymph node, biopsy, L 10 ONE BENIGN LYMPH NODE (0/1) 8. Lymph node, biopsy, L 10 #2 ONE BENIGN LYMPH NODE (0/1) 9. Lymph node, biopsy, L 10 #3 BENIGN FIBROADIPOSE TISSUE 10. Lymph node, biopsy, L 11 ONE BENIGN LYMPH  NODE (0/1) 11. Lymph node, biopsy, L 7 #4 ONE BENIGN LYMPH NODE (0/1) 12. Lymph node, biopsy, L 10 #4 ONE BENIGN LYMPH NODE (0/1) 13. Lymph node, biopsy, L 4R ONE BENIGN LYMPH NODE (0/1) 14. Lymph node, biopsy, L 4R #2 ONE BENIGN LYMPH NODE (0/1)  Treatments: surgery:   Right video-assisted thoracoscopy, thoracoscopic right upper lobectomy with mediastinal lymph node dissection, On-Q local anesthetic catheter placement.  Disposition: Home  Discharge Medications:   Allergies as of 11/05/2016      Reactions   No Known Allergies       Medication  List    TAKE these medications   aspirin EC 81 MG tablet Take 81 mg by mouth daily.   buPROPion 150 MG 24 hr tablet Commonly known as:  WELLBUTRIN XL Take 150 mg by mouth 2 (two) times daily.   desloratadine 5 MG tablet Commonly known as:  CLARINEX Take 5 mg by mouth daily.   ergocalciferol 50000 units capsule Commonly known as:  VITAMIN D2 Take 50,000 Units by mouth once a week. X 8 weeks   Ferrous Fumarate 29 MG Tabs Take 1 tablet by mouth daily.   Fish Oil 1000 MG Caps Take 1 capsule by mouth daily.   Garlic 086 MG Caps Take 1 capsule by mouth daily.   gemfibrozil 600 MG tablet Commonly known as:  LOPID Take 600 mg by mouth 2 (two) times daily before a meal.   insulin glargine 100 UNIT/ML injection Commonly known as:  LANTUS Inject 20 Units into the skin daily as needed (>150).   levothyroxine 50 MCG tablet Commonly known as:  SYNTHROID, LEVOTHROID Take 50 mcg by mouth daily before breakfast.   lisinopril 20 MG tablet Commonly known as:  PRINIVIL,ZESTRIL Take 20 mg by mouth every evening.   meclizine 25 MG tablet Commonly known as:  ANTIVERT Take 25 mg by mouth 3 (three) times daily as needed for dizziness.   metFORMIN 1000 MG tablet Commonly known as:  GLUCOPHAGE Take 1,000 mg by mouth 2 (two) times daily with a meal.   NICOTINE STEP 1 21 mg/24hr patch Generic drug:  nicotine APPLY 1 PATCH EVERY 24 HOURS AFTER REMOVING OLD PATCH   omeprazole 20 MG capsule Commonly known as:  PRILOSEC Take 20 mg by mouth 2 (two) times daily before a meal.   oxyCODONE 5 MG immediate release tablet Commonly known as:  Oxy IR/ROXICODONE Take 1-2 tablets (5-10 mg total) by mouth every 6 (six) hours as needed for severe pain.   simvastatin 80 MG tablet Commonly known as:  ZOCOR Take 80 mg by mouth daily.   triamterene-hydrochlorothiazide 37.5-25 MG tablet Commonly known as:  MAXZIDE-25 Take 1 tablet by mouth daily.      Follow-up Information    Raven Nakayama, MD Follow up on 11/19/2016.   Specialty:  Cardiothoracic Surgery Why:  Appointment is at 12:45, please get CXR at 12:00 at [facility] located on first floor of our office building Contact information: [place] [address] [phone]           Signed: John Giovanni 11/05/2016, 9:21 AM

## 2016-11-01 NOTE — Care Management Note (Addendum)
Case Management Note  Patient Details  Name: [name] MRN: [id number] Date of Birth: 01-26-1961  Subjective/Objective:    S/p VATs, Lobectomy, has chest tube to water seal, will dc the chest tomorrow, has a small pnemo, conts with pca,  NCM will cont to follow for dc needs.      1/29 [place], BSN - patient with 15% ptx today, will repeat cxr in am if stable will plan for dc home tomorrow.    1/30 [place], BSN - patient for dc to home today, she states she will need to go to medical records to get paper work showing she was here for her [employer], the volunteer will stop her by medical records for this request.  She has no other needs.             Action/Plan:   Expected Discharge Date:                  Expected Discharge Plan:  Home/Self Care  In-House Referral:     Discharge planning Services  CM Consult  Post Acute Care Choice:    Choice offered to:     DME Arranged:    DME Agency:     HH Arranged:    HH Agency:     Status of Service:  In process, will continue to follow  If discussed at Long Length of Stay Meetings, dates discussed:    Additional Comments:  Zenon Mayo, RN 11/01/2016, 2:04 PM

## 2016-11-02 ENCOUNTER — Inpatient Hospital Stay (HOSPITAL_COMMUNITY): Payer: BLUE CROSS/BLUE SHIELD

## 2016-11-02 LAB — GLUCOSE, CAPILLARY
GLUCOSE-CAPILLARY: 184 mg/dL — AB (ref 65–99)
Glucose-Capillary: 112 mg/dL — ABNORMAL HIGH (ref 65–99)
Glucose-Capillary: 122 mg/dL — ABNORMAL HIGH (ref 65–99)
Glucose-Capillary: 123 mg/dL — ABNORMAL HIGH (ref 65–99)
Glucose-Capillary: 143 mg/dL — ABNORMAL HIGH (ref 65–99)

## 2016-11-02 MED ORDER — LEVALBUTEROL HCL 0.63 MG/3ML IN NEBU: 0.6300 mg | INHALATION_SOLUTION | Freq: Four times a day (QID) | RESPIRATORY_TRACT | Status: DC | PRN

## 2016-11-02 NOTE — Progress Notes (Addendum)
[place]       [place] [address]             [phone]      3 Days Post-Op Procedure(s) (LRB): VIDEO ASSISTED THORACOSCOPY (VATS)/RIGHT UPPER LOBECTOMY (Right) Subjective: CXR not done yet, some productive sputum/mild SOB, some pain - ok with PCA use  Objective: Vital signs in last 24 hours: Temp:  [97.8 F (36.6 C)-98.4 F (36.9 C)] 97.8 F (36.6 C) (01/27 1100) Pulse Rate:  [71-93] 77 (01/27 0719) Cardiac Rhythm: Normal sinus rhythm (01/27 1218) Resp:  [11-21] 20 (01/27 1200) BP: (93-130)/(41-69) 115/69 (01/27 0719) SpO2:  [95 %-100 %] 95 % (01/27 1200)  Hemodynamic parameters for last 24 hours:    Intake/Output from previous day: 01/26 0701 - 01/27 0700 In: 1820 [P.O.:1560; I.V.:260] Out: 805 [Urine:625; Chest Tube:180] Intake/Output this shift: Total I/O In: -  Out: 250 [Urine:250]  General appearance: alert, cooperative and no distress Heart: regular rate and rhythm Lungs: mildly dim in bases Abdomen: benign Extremities: no edema Wound: incis healing well  Lab Results:  Recent Labs  10/31/16 0330 11/01/16 0431  WBC 13.4* 10.5  HGB 10.0* 9.5*  HCT 30.4* 28.5*  PLT 235 224   BMET:  Recent Labs  10/31/16 0330 11/01/16 0431  NA 136 133*  K 4.2 4.0  CL 104 99*  CO2 24 27  GLUCOSE 137* 154*  BUN 21* 19  CREATININE 0.94 1.01*  CALCIUM 8.7* 9.0    PT/INR: No results for input(s): LABPROT, INR in the last 72 hours. ABG    Component Value Date/Time   PHART 7.371 10/31/2016 0257   HCO3 24.8 10/31/2016 0257   ACIDBASEDEF 1.0 10/28/2016 1624   O2SAT 97.7 10/31/2016 0257   CBG (last 3)   Recent Labs  11/01/16 2140 11/02/16 0829 11/02/16 1159  GLUCAP 162* 123* 112*    Meds Scheduled Meds: . acetaminophen  1,000 mg Oral Q6H   Or  . acetaminophen (TYLENOL) oral liquid 160 mg/5 mL  1,000 mg Oral Q6H  . aspirin EC  81 mg Oral Daily  . atorvastatin  40 mg Oral q1800  . bisacodyl  10 mg Oral Daily  .  buPROPion  150 mg Oral BID  . enoxaparin (LOVENOX) injection  40 mg Subcutaneous Daily  . fentaNYL   Intravenous Q4H  . gemfibrozil  600 mg Oral BID AC  . insulin aspart  0-20 Units Subcutaneous TID WC  . insulin aspart  0-5 Units Subcutaneous QHS  . insulin glargine  20 Units Subcutaneous Daily  . levalbuterol  0.63 mg Nebulization Q6H  . levothyroxine  50 mcg Oral QAC breakfast  . lisinopril  20 mg Oral QPM  . loratadine  10 mg Oral Daily  . metFORMIN  1,000 mg Oral BID WC  . nicotine  21 mg Transdermal Daily  . pantoprazole  40 mg Oral Daily  . senna-docusate  1 tablet Oral QHS  . triamterene-hydrochlorothiazide  1 tablet Oral Daily   Continuous Infusions: . sodium chloride 10 mL/hr at 10/31/16 1900  . bupivacaine ON-Q pain pump     PRN Meds:.diphenhydrAMINE **OR** diphenhydrAMINE, meclizine, naloxone **AND** sodium chloride flush, ondansetron (ZOFRAN) IV, ondansetron (ZOFRAN) IV, oxyCODONE, potassium chloride (KCL MULTIRUN) 30 mEq in 265 mL IVPB, traMADol  Xrays Dg Chest Port 1 View  Result Date: 11/01/2016 CLINICAL DATA:  Status post lobectomy of lung EXAM: PORTABLE CHEST 1 VIEW COMPARISON:  10/31/2016 FINDINGS: Status post right upper lobectomy. Small right  apical pneumothorax with indwelling right chest tube. Mild left lower lobe atelectasis. Possible trace left pleural effusion. The heart is top-normal in size. Right IJ venous catheter terminates in the lower SVC. IMPRESSION: Status post right upper lobectomy. Small right pneumothorax with indwelling right chest tube. Electronically Signed   By: Julian Hy M.D.   On: 11/01/2016 09:14   Chest tube- + tidaling without air leak  Assessment/Plan: S/P Procedure(s) (LRB): VIDEO ASSISTED THORACOSCOPY (VATS)/RIGHT UPPER LOBECTOMY (Right)  1 conts to progress well 2 check CXR- poss d/c tube if ok 3 sputum culture, recheck labs in am, no fevers 4 push rehab/pulm toilet  LOS: 3 days    GOLD,WAYNE E 11/02/2016  Dg Chest  Port 1 View  Result Date: 11/02/2016 CLINICAL DATA:  Post right upper lobectomy. EXAM: PORTABLE CHEST 1 VIEW COMPARISON:  11/01/2016 FINDINGS: Cardiomediastinal silhouette is normal. Mediastinal contours appear intact. Right chest tube is in stable position. Previously-seen right pneumothorax is less apparent. There is small amount of fluid in the right pleural space. Osseous structures are without acute abnormality. Soft tissues are grossly normal. IMPRESSION: Decreased in size right apical pneumothorax. Small amount of fluid in the right pleural space. Electronically Signed   By: Fidela Salisbury M.D.   On: 11/02/2016 15:32   No air leak , d/c onq and pca pump check xray in am Poss tube out tomorrow  I have seen and examined Tanda Rockers and agree with the above assessment  and plan.  Grace Isaac MD [place] [phone] Office [phone] 11/02/2016 7:19 PM

## 2016-11-03 ENCOUNTER — Inpatient Hospital Stay (HOSPITAL_COMMUNITY): Payer: BLUE CROSS/BLUE SHIELD

## 2016-11-03 LAB — GLUCOSE, CAPILLARY
GLUCOSE-CAPILLARY: 137 mg/dL — AB (ref 65–99)
Glucose-Capillary: 141 mg/dL — ABNORMAL HIGH (ref 65–99)
Glucose-Capillary: 151 mg/dL — ABNORMAL HIGH (ref 65–99)
Glucose-Capillary: 154 mg/dL — ABNORMAL HIGH (ref 65–99)

## 2016-11-03 LAB — BASIC METABOLIC PANEL
ANION GAP: 12 (ref 5–15)
BUN: 22 mg/dL — ABNORMAL HIGH (ref 6–20)
CHLORIDE: 99 mmol/L — AB (ref 101–111)
CO2: 26 mmol/L (ref 22–32)
Calcium: 9.6 mg/dL (ref 8.9–10.3)
Creatinine, Ser: 1.02 mg/dL — ABNORMAL HIGH (ref 0.44–1.00)
GFR calc Af Amer: >60 mL/min (ref >60)
GLUCOSE: 139 mg/dL — AB (ref 65–99)
POTASSIUM: 4 mmol/L (ref 3.5–5.1)
Sodium: 137 mmol/L (ref 135–145)

## 2016-11-03 LAB — CBC
HEMATOCRIT: 29.1 % — AB (ref 36.0–46.0)
HEMOGLOBIN: 9.7 g/dL — AB (ref 12.0–15.0)
MCH: 30.5 pg (ref 26.0–34.0)
MCHC: 33.3 g/dL (ref 30.0–36.0)
MCV: 91.5 fL (ref 78.0–100.0)
Platelets: 286 10*3/uL (ref 150–400)
RBC: 3.18 MIL/uL — AB (ref 3.87–5.11)
RDW: 12.6 % (ref 11.5–15.5)
WBC: 8.3 10*3/uL (ref 4.0–10.5)

## 2016-11-03 LAB — EXPECTORATED SPUTUM ASSESSMENT W GRAM STAIN, RFLX TO RESP C

## 2016-11-03 MED ORDER — LACTULOSE 10 GM/15ML PO SOLN
30.0000 g | Freq: Every day | ORAL | Status: DC | PRN
  Administered 2016-11-03: 30 g via ORAL
  Filled 2016-11-03: qty 45

## 2016-11-03 NOTE — Progress Notes (Signed)
[place]       [place] [address]             [phone]      4 Days Post-Op Procedure(s) (LRB): VIDEO ASSISTED THORACOSCOPY (VATS)/RIGHT UPPER LOBECTOMY (Right) Subjective: Feels better, no recent BM  Objective: Vital signs in last 24 hours: Temp:  [97.4 F (36.3 C)-98.4 F (36.9 C)] 98.1 F (36.7 C) (01/28 0747) Pulse Rate:  [78-91] 78 (01/28 0412) Cardiac Rhythm: Normal sinus rhythm (01/28 0727) Resp:  [14-20] 14 (01/28 0412) BP: (102-123)/(45-76) 123/61 (01/28 0747) SpO2:  [95 %-100 %] 96 % (01/28 0412)  Hemodynamic parameters for last 24 hours:    Intake/Output from previous day: 01/27 0701 - 01/28 0700 In: 680 [P.O.:480; I.V.:200] Out: 1175 [Urine:1125; Chest Tube:50] Intake/Output this shift: Total I/O In: -  Out: 150 [Urine:150]  General appearance: alert, cooperative and no distress Heart: regular rate and rhythm Lungs: clear to auscultation bilaterally Abdomen: benign Extremities: no edema Wound: incis healing well  Lab Results:  Recent Labs  11/01/16 0431 11/03/16 0523  WBC 10.5 8.3  HGB 9.5* 9.7*  HCT 28.5* 29.1*  PLT 224 286   BMET:  Recent Labs  11/01/16 0431 11/03/16 0523  NA 133* 137  K 4.0 4.0  CL 99* 99*  CO2 27 26  GLUCOSE 154* 139*  BUN 19 22*  CREATININE 1.01* 1.02*  CALCIUM 9.0 9.6    PT/INR: No results for input(s): LABPROT, INR in the last 72 hours. ABG    Component Value Date/Time   PHART 7.371 10/31/2016 0257   HCO3 24.8 10/31/2016 0257   ACIDBASEDEF 1.0 10/28/2016 1624   O2SAT 97.7 10/31/2016 0257   CBG (last 3)   Recent Labs  11/02/16 2140 11/02/16 2342 11/03/16 0746  GLUCAP 143* 122* 141*    Meds Scheduled Meds: . acetaminophen  1,000 mg Oral Q6H   Or  . acetaminophen (TYLENOL) oral liquid 160 mg/5 mL  1,000 mg Oral Q6H  . aspirin EC  81 mg Oral Daily  . atorvastatin  40 mg Oral q1800  . bisacodyl  10 mg Oral Daily  . buPROPion  150 mg Oral BID  . enoxaparin  (LOVENOX) injection  40 mg Subcutaneous Daily  . gemfibrozil  600 mg Oral BID AC  . insulin aspart  0-20 Units Subcutaneous TID WC  . insulin aspart  0-5 Units Subcutaneous QHS  . insulin glargine  20 Units Subcutaneous Daily  . levothyroxine  50 mcg Oral QAC breakfast  . lisinopril  20 mg Oral QPM  . loratadine  10 mg Oral Daily  . metFORMIN  1,000 mg Oral BID WC  . nicotine  21 mg Transdermal Daily  . pantoprazole  40 mg Oral Daily  . senna-docusate  1 tablet Oral QHS  . triamterene-hydrochlorothiazide  1 tablet Oral Daily   Continuous Infusions: . sodium chloride 10 mL/hr at 10/31/16 1900   PRN Meds:.levalbuterol, meclizine, ondansetron (ZOFRAN) IV, oxyCODONE, potassium chloride (KCL MULTIRUN) 30 mEq in 265 mL IVPB, traMADol  Xrays Dg Chest Port 1 View  Result Date: 11/03/2016 CLINICAL DATA:  Occasional postop pain.  Chest tube. EXAM: PORTABLE CHEST 1 VIEW COMPARISON:  11/02/2016 FINDINGS: Right chest tube remains in place as does right central line, unchanged. No visible pneumothorax. Low volumes. No confluent airspace opacities. Heart is normal size. IMPRESSION: No visible pneumothorax currently.  No acute findings. Electronically Signed   By: Rolm Baptise M.D.   On: 11/03/2016 09:04  Dg Chest Port 1 View  Result Date: 11/02/2016 CLINICAL DATA:  Post right upper lobectomy. EXAM: PORTABLE CHEST 1 VIEW COMPARISON:  11/01/2016 FINDINGS: Cardiomediastinal silhouette is normal. Mediastinal contours appear intact. Right chest tube is in stable position. Previously-seen right pneumothorax is less apparent. There is small amount of fluid in the right pleural space. Osseous structures are without acute abnormality. Soft tissues are grossly normal. IMPRESSION: Decreased in size right apical pneumothorax. Small amount of fluid in the right pleural space. Electronically Signed   By: Fidela Salisbury M.D.   On: 11/02/2016 15:32    Assessment/Plan: S/P Procedure(s) (LRB): VIDEO ASSISTED  THORACOSCOPY (VATS)/RIGHT UPPER LOBECTOMY (Right)  1 no pneumothorax or air leak- d/c CT today 2 add lactulose for constipation 3 sugars adeq controlled 4 labs stable 5 cont pulm toilet/rehab, poss home in am  LOS: 4 days    Terris Bodin E 11/03/2016

## 2016-11-03 NOTE — Progress Notes (Signed)
Patient PCA D/C'd 31m of Fentanyl wasted in sink. Heather RN witnessed. KLenna Sciara RN 11/03/2016 6:56 AM

## 2016-11-03 NOTE — Progress Notes (Signed)
Right apical pneumothorax  5-10% noted on CXR by radiologist. PA notified. Vital signs stable. No new orders. Will continue to monitor.

## 2016-11-04 ENCOUNTER — Inpatient Hospital Stay (HOSPITAL_COMMUNITY): Payer: BLUE CROSS/BLUE SHIELD

## 2016-11-04 LAB — GLUCOSE, CAPILLARY
GLUCOSE-CAPILLARY: 153 mg/dL — AB (ref 65–99)
GLUCOSE-CAPILLARY: 119 mg/dL — AB (ref 65–99)
GLUCOSE-CAPILLARY: 163 mg/dL — AB (ref 65–99)
Glucose-Capillary: 147 mg/dL — ABNORMAL HIGH (ref 65–99)

## 2016-11-04 NOTE — Progress Notes (Addendum)
      [address]       ,[place] [address]             [phone]      5 Days Post-Op Procedure(s) (LRB): VIDEO ASSISTED THORACOSCOPY (VATS)/RIGHT UPPER LOBECTOMY (Right)   Subjective:  Doing okay.  Pain at incisional site.  States it feels swollen and tender.  Wants to go home.  Objective: Vital signs in last 24 hours: Temp:  [97.8 F (36.6 C)-98.7 F (37.1 C)] 98.7 F (37.1 C) (01/29 1105) Pulse Rate:  [72-88] 76 (01/29 1105) Cardiac Rhythm: Normal sinus rhythm (01/29 0824) Resp:  [15-20] 17 (01/29 1105) BP: (94-122)/(50-77) 113/51 (01/29 1105) SpO2:  [95 %-99 %] 99 % (01/29 1105)  Intake/Output from previous day: 01/28 0701 - 01/29 0700 In: 480 [P.O.:480] Out: 150 [Urine:150]  General appearance: alert, cooperative and no distress Heart: regular rate and rhythm Lungs: clear to auscultation bilaterally Abdomen: soft, non-tender; bowel sounds normal; no masses,  no organomegaly Extremities: extremities normal, atraumatic, no cyanosis or edema Wound: clean and dry, small hematoma along posterior portion of incision, + ecchymosis present  Lab Results:  Recent Labs  11/03/16 0523  WBC 8.3  HGB 9.7*  HCT 29.1*  PLT 286   BMET:  Recent Labs  11/03/16 0523  NA 137  K 4.0  CL 99*  CO2 26  GLUCOSE 139*  BUN 22*  CREATININE 1.02*  CALCIUM 9.6    PT/INR: No results for input(s): LABPROT, INR in the last 72 hours. ABG    Component Value Date/Time   PHART 7.371 10/31/2016 0257   HCO3 24.8 10/31/2016 0257   ACIDBASEDEF 1.0 10/28/2016 1624   O2SAT 97.7 10/31/2016 0257   CBG (last 3)   Recent Labs  11/03/16 2120 11/04/16 0728 11/04/16 1104  GLUCAP 154* 153* 119*    Assessment/Plan: S/P Procedure(s) (LRB): VIDEO ASSISTED THORACOSCOPY (VATS)/RIGHT UPPER LOBECTOMY (Right)  1. Pulm- CXR shows increase in pneumothorax post chest tube removal 2. CV-hemodynamically stable 3. DM- sugars controlled 4. Dispo- patient stable, will  repeat CXR in AM if pneumothorax is stable or better will plan to d/c home   LOS: 5 days    Ahmed Prima, ERIN 11/04/2016 Chest x-ray today personally reviewed showing minimal change in right pneumothorax. The small pneumothorax is asymptomatic Agree with plans for discharge in a.m. if morning chest x-ray is stable or improved Tharon Aquas trigt M.D.

## 2016-11-04 NOTE — Progress Notes (Signed)
Right Apical pneumothorax interval increase in volume to 15% on CXR by radiologist. Harriet Pho, PA made aware. Vitals stable. RN will continue to monitor.

## 2016-11-05 ENCOUNTER — Inpatient Hospital Stay (HOSPITAL_COMMUNITY): Payer: BLUE CROSS/BLUE SHIELD

## 2016-11-05 ENCOUNTER — Other Ambulatory Visit: Payer: Self-pay | Admitting: *Deleted

## 2016-11-05 DIAGNOSIS — Z736 Limitation of activities due to disability: Secondary | ICD-10-CM

## 2016-11-05 LAB — CULTURE, RESPIRATORY

## 2016-11-05 LAB — GLUCOSE, CAPILLARY: Glucose-Capillary: 168 mg/dL — ABNORMAL HIGH (ref 65–99)

## 2016-11-05 LAB — CULTURE, RESPIRATORY W GRAM STAIN: Culture: NORMAL

## 2016-11-05 MED ORDER — OXYCODONE HCL 5 MG PO TABS: 5.0000 mg | ORAL_TABLET | Freq: Four times a day (QID) | ORAL | 0 refills | Status: DC | PRN

## 2016-11-05 NOTE — Progress Notes (Signed)
[address] [place] [address]       [place] [address]             [phone]      6 Days Post-Op Procedure(s) (LRB): VIDEO ASSISTED THORACOSCOPY (VATS)/RIGHT UPPER LOBECTOMY (Right) Subjective:  CXR is pending  Objective: Vital signs in last 24 hours: Temp:  [97.9 F (36.6 C)-98.7 F (37.1 C)] 97.9 F (36.6 C) (01/30 0700) Pulse Rate:  [72-82] 72 (01/30 0700) Cardiac Rhythm: Normal sinus rhythm (01/30 0340) Resp:  [12-18] 13 (01/30 0700) BP: (90-117)/(40-63) 105/54 (01/30 0700) SpO2:  [94 %-99 %] 98 % (01/30 0700)  Hemodynamic parameters for last 24 hours:    Intake/Output from previous day: 01/29 0701 - 01/30 0700 In: 240 [P.O.:240] Out: -  Intake/Output this shift: No intake/output data recorded.  General appearance: alert, cooperative and no distress Heart: regular rate and rhythm Lungs: clear to auscultation bilaterally Abdomen: benign Extremities: no edema Wound: incis healing well, some area of possible hematoma/echymosis- no evid of infection  Lab Results:  Recent Labs  11/03/16 0523  WBC 8.3  HGB 9.7*  HCT 29.1*  PLT 286   BMET:  Recent Labs  11/03/16 0523  NA 137  K 4.0  CL 99*  CO2 26  GLUCOSE 139*  BUN 22*  CREATININE 1.02*  CALCIUM 9.6    PT/INR: No results for input(s): LABPROT, INR in the last 72 hours. ABG    Component Value Date/Time   PHART 7.371 10/31/2016 0257   HCO3 24.8 10/31/2016 0257   ACIDBASEDEF 1.0 10/28/2016 1624   O2SAT 97.7 10/31/2016 0257   CBG (last 3)   Recent Labs  11/04/16 1606 11/04/16 2106 11/05/16 0813  GLUCAP 147* 163* 168*    Meds Scheduled Meds: . aspirin EC  81 mg Oral Daily  . atorvastatin  40 mg Oral q1800  . bisacodyl  10 mg Oral Daily  . buPROPion  150 mg Oral BID  . enoxaparin (LOVENOX) injection  40 mg Subcutaneous Daily  . gemfibrozil  600 mg Oral BID AC  . insulin aspart  0-20 Units Subcutaneous TID WC  . insulin aspart  0-5 Units Subcutaneous QHS  . insulin glargine   20 Units Subcutaneous Daily  . levothyroxine  50 mcg Oral QAC breakfast  . lisinopril  20 mg Oral QPM  . loratadine  10 mg Oral Daily  . metFORMIN  1,000 mg Oral BID WC  . nicotine  21 mg Transdermal Daily  . pantoprazole  40 mg Oral Daily  . senna-docusate  1 tablet Oral QHS  . triamterene-hydrochlorothiazide  1 tablet Oral Daily   Continuous Infusions: . sodium chloride Stopped (11/04/16 0410)   PRN Meds:.lactulose, levalbuterol, meclizine, ondansetron (ZOFRAN) IV, oxyCODONE, potassium chloride (KCL MULTIRUN) 30 mEq in 265 mL IVPB, traMADol  Xrays Dg Chest 2 View  Result Date: 11/04/2016 CLINICAL DATA:  Status post right upper lobectomy with postoperative pneumothorax. Chest tube removal yesterday EXAM: CHEST  2 VIEW COMPARISON:  Portable chest x-ray of November 03, 2016 FINDINGS: Since removal of the chest tube there is been interval increase in the volume of the right-sided pneumothorax. It amounts to 15% of the lung volume. There is a small right pleural effusion. There is no mediastinal shift. The left lung is clear. The heart and pulmonary vascularity are normal. The bony thorax exhibits no acute abnormality. IMPRESSION: Interval increase in the volume of the right-sided pneumothorax such that it amounts to 15% of the lung volume. These results  were called by telephone at the time of interpretation on 11/04/2016 at 7:07 am to [facility], [place], who verbally acknowledged these results. Electronically Signed   By: David  Martinique M.D.   On: 11/04/2016 07:07   Dg Chest Port 1 View  Result Date: 11/03/2016 CLINICAL DATA:  Chest tube removal EXAM: PORTABLE CHEST 1 VIEW COMPARISON:  11/03/2016 FINDINGS: Interval removal of the right chest tube. Small right apical pneumothorax, 5-10%, new since prior study. Low lung volumes. Right central line is unchanged. Patchy right upper lobe opacities, likely atelectasis. IMPRESSION: Interval removal of right chest tube with 5-10% right apical pneumothorax. These  results will be called to the ordering clinician or representative by the Radiologist Assistant, and communication documented in the PACS or zVision Dashboard. Electronically Signed   By: Rolm Baptise M.D.   On: 11/03/2016 12:32   Dg Chest Port 1 View  Result Date: 11/03/2016 CLINICAL DATA:  Occasional postop pain.  Chest tube. EXAM: PORTABLE CHEST 1 VIEW COMPARISON:  11/02/2016 FINDINGS: Right chest tube remains in place as does right central line, unchanged. No visible pneumothorax. Low volumes. No confluent airspace opacities. Heart is normal size. IMPRESSION: No visible pneumothorax currently.  No acute findings. Electronically Signed   By: Rolm Baptise M.D.   On: 11/03/2016 09:04    Assessment/Plan: S/P Procedure(s) (LRB): VIDEO ASSISTED THORACOSCOPY (VATS)/RIGHT UPPER LOBECTOMY (Right)  1 clinically very stable, if CXR stable will d/c home   LOS: 6 days    Sajid Ruppert E 11/05/2016

## 2016-11-05 NOTE — Progress Notes (Signed)
Discharge education done with patient. Rn educated on medications and when to take them, follow up appointments, showering and care of incision, signs and symptoms to look out for, and when to call the Md.   Patient discharged by wheelchair with volunteer services.  Phineas Douglas, RN

## 2016-11-06 LAB — TYPE AND SCREEN
ABO/RH(D): AB POS
Antibody Screen: NEGATIVE
UNIT DIVISION: 0
Unit division: 0

## 2016-11-12 ENCOUNTER — Ambulatory Visit (INDEPENDENT_AMBULATORY_CARE_PROVIDER_SITE_OTHER): Payer: Self-pay

## 2016-11-12 DIAGNOSIS — Z902 Acquired absence of lung [part of]: Secondary | ICD-10-CM

## 2016-11-12 DIAGNOSIS — Z4802 Encounter for removal of sutures: Secondary | ICD-10-CM

## 2016-11-12 NOTE — Progress Notes (Signed)
Removed 1 sutures from chest tube site with no signs of infections and patient tolerated well.

## 2016-11-18 ENCOUNTER — Other Ambulatory Visit: Payer: Self-pay | Admitting: Thoracic Surgery (Cardiothoracic Vascular Surgery)

## 2016-11-18 DIAGNOSIS — Z902 Acquired absence of lung [part of]: Secondary | ICD-10-CM

## 2016-11-19 ENCOUNTER — Ambulatory Visit
Admission: RE | Admit: 2016-11-19 | Discharge: 2016-11-19 | Disposition: A | Discharge status: Home / Self Care | Payer: BLUE CROSS/BLUE SHIELD | Source: Ambulatory Visit | Attending: Thoracic Surgery (Cardiothoracic Vascular Surgery) | Admitting: Thoracic Surgery (Cardiothoracic Vascular Surgery)

## 2016-11-19 ENCOUNTER — Encounter: Payer: Self-pay | Admitting: Thoracic Surgery (Cardiothoracic Vascular Surgery)

## 2016-11-19 ENCOUNTER — Ambulatory Visit (INDEPENDENT_AMBULATORY_CARE_PROVIDER_SITE_OTHER): Payer: Self-pay | Admitting: Thoracic Surgery (Cardiothoracic Vascular Surgery)

## 2016-11-19 ENCOUNTER — Encounter: Payer: Self-pay | Admitting: *Deleted

## 2016-11-19 VITALS — BP 117/73 | HR 83 | Resp 20 | Ht 73.0 in | Wt 266.0 lb

## 2016-11-19 DIAGNOSIS — Z09 Encounter for follow-up examination after completed treatment for conditions other than malignant neoplasm: Secondary | ICD-10-CM

## 2016-11-19 DIAGNOSIS — Z902 Acquired absence of lung [part of]: Secondary | ICD-10-CM

## 2016-11-19 DIAGNOSIS — C3411 Malignant neoplasm of upper lobe, right bronchus or lung: Secondary | ICD-10-CM

## 2016-11-19 NOTE — Progress Notes (Signed)
A referral was faxed to [facility] to contact [name] to begin Pulmonary Rehab. She is s/p RULobectomy 10/30/16 for cancer.  Office records were also faxed to facilitate her therapy.

## 2016-11-19 NOTE — Progress Notes (Signed)
[address]       [place] [address]             [phone]       HPI: Ms. Fredman returns for a scheduled follow-up visit  She is a 56 year old smoker who had a thoracoscopic right upper lobectomy for a stage IA adenocarcinoma on 10/30/2016. Her postoperative course was unremarkable and she went home on day 6.  She is not having any problems with her breathing although she does get out of breath quicker with walking since her surgery. She has some mild discomfort.  Past Medical History:  Diagnosis Date  . Arthritis   . Cancer [facility])    h/o cervical  . Cancer ([place])    RULobe of the lung.Marland Kitchenadenocarcinoma per CT BX 09/03/16  . Diabetes mellitus without complication ([place])   . Ectopic pregnancy   . Gall stones   . GERD (gastroesophageal reflux disease)   . Hyperlipidemia   . Hypertension   . Hypothyroidism   . Thyroid disease    hypothyroidism     Current Outpatient Prescriptions  Medication Sig Dispense Refill  . aspirin EC 81 MG tablet Take 81 mg by mouth daily.    Marland Kitchen buPROPion (WELLBUTRIN XL) 150 MG 24 hr tablet Take 150 mg by mouth 2 (two) times daily.  2  . desloratadine (CLARINEX) 5 MG tablet Take 5 mg by mouth daily.    . ergocalciferol (VITAMIN D2) 50000 units capsule Take 50,000 Units by mouth once a week. X 8 weeks    . Ferrous Fumarate 29 MG TABS Take 1 tablet by mouth daily.    . Garlic 332 MG CAPS Take 1 capsule by mouth daily.    Marland Kitchen gemfibrozil (LOPID) 600 MG tablet Take 600 mg by mouth 2 (two) times daily before a meal.    . insulin glargine (LANTUS) 100 UNIT/ML injection Inject 20 Units into the skin daily as needed (>150).     Marland Kitchen levothyroxine (SYNTHROID, LEVOTHROID) 50 MCG tablet Take 50 mcg by mouth daily before breakfast.    . lisinopril (PRINIVIL,ZESTRIL) 20 MG tablet Take 20 mg by mouth every evening.     . meclizine (ANTIVERT) 25 MG tablet Take 25 mg by mouth 3 (three) times daily as needed for dizziness.    . metFORMIN  (GLUCOPHAGE) 1000 MG tablet Take 1,000 mg by mouth 2 (two) times daily with a meal.    . NICOTINE STEP 1 21 MG/24HR patch APPLY 1 PATCH EVERY 24 HOURS AFTER REMOVING OLD PATCH  0  . Omega-3 Fatty Acids (FISH OIL) 1000 MG CAPS Take 1 capsule by mouth daily.    Marland Kitchen omeprazole (PRILOSEC) 20 MG capsule Take 20 mg by mouth 2 (two) times daily before a meal.    . oxyCODONE (OXY IR/ROXICODONE) 5 MG immediate release tablet Take 1-2 tablets (5-10 mg total) by mouth every 6 (six) hours as needed for severe pain. 30 tablet 0  . simvastatin (ZOCOR) 80 MG tablet Take 80 mg by mouth daily.    Marland Kitchen triamterene-hydrochlorothiazide (MAXZIDE-25) 37.5-25 MG tablet Take 1 tablet by mouth daily.     No current facility-administered medications for this visit.     Physical Exam BP 117/73   Pulse 83   Resp 20   Ht '6\' 1"'$  (1.854 m)   Wt 266 lb (120.7 kg)   SpO2 96% Comment: RA  BMI 35.75 kg/m  56 year old woman in no acute distress Obese Cardiac regular rate and rhythm  normal S1 and S2 Lungs mildly diminished right base, otherwise clear Incisions with some ecchymosis, no signs of infection  Diagnostic Tests: EXAM: CHEST  2 VIEW  COMPARISON:  11/05/2016  FINDINGS: Resolved right pneumothorax. There is no evidence of pulmonary edema, consolidation, pneumothorax, nodule or pleural fluid. The heart size and mediastinal contours are within normal limits. Bony structures are unremarkable.  IMPRESSION: Resolved postoperative right pneumothorax.  No acute findings.   Electronically Signed   By: Aletta Edouard M.D.   On: 11/19/2016 12:18  Impression: Ms. Liz is a 56 year old woman with history of tobacco abuse who had a thoracoscopic right upper lobectomy for a stage IA non-small cell carcinoma about 2 and half weeks ago. She is doing well at this point.  She does still have some discomfort, but it is not severe. Her incisions appear to be healing well.  I think she would benefit from  pulmonary rehabilitation I given her smoking history and diagnosis of lung cancer and her obesity. I will see if we can get her into a program done at [facility].  She needs to follow-up with Dr. Alcide Clever and Bobby Rumpf   She may drive. There are no restrictions on her activities but she should build into new activities gradually to avoid undue discomfort  Tobacco cessation - She says that she has not smoked since her surgery.  Plan: Referral to pulmonary rehabilitation Follow-up with Dr. Alcide Clever and Bobby Rumpf in [place] I will see her back in about 2 months just to check on her progress from surgery.  Melrose Nakayama, MD [facility] [phone]

## 2016-11-21 DIAGNOSIS — F17211 Nicotine dependence, cigarettes, in remission: Secondary | ICD-10-CM | POA: Diagnosis not present

## 2016-11-21 DIAGNOSIS — C3411 Malignant neoplasm of upper lobe, right bronchus or lung: Secondary | ICD-10-CM | POA: Diagnosis not present

## 2017-01-10 ENCOUNTER — Other Ambulatory Visit: Payer: Self-pay | Admitting: Thoracic Surgery (Cardiothoracic Vascular Surgery)

## 2017-01-10 DIAGNOSIS — Z902 Acquired absence of lung [part of]: Secondary | ICD-10-CM

## 2017-01-14 ENCOUNTER — Ambulatory Visit (INDEPENDENT_AMBULATORY_CARE_PROVIDER_SITE_OTHER): Payer: Self-pay | Admitting: Thoracic Surgery (Cardiothoracic Vascular Surgery)

## 2017-01-14 ENCOUNTER — Ambulatory Visit
Admission: RE | Admit: 2017-01-14 | Discharge: 2017-01-14 | Disposition: A | Discharge status: Home / Self Care | Payer: BLUE CROSS/BLUE SHIELD | Source: Ambulatory Visit | Attending: Thoracic Surgery (Cardiothoracic Vascular Surgery) | Admitting: Thoracic Surgery (Cardiothoracic Vascular Surgery)

## 2017-01-14 VITALS — BP 136/73 | HR 80 | Resp 20 | Ht 73.0 in | Wt 265.0 lb

## 2017-01-14 DIAGNOSIS — Z902 Acquired absence of lung [part of]: Secondary | ICD-10-CM

## 2017-01-14 DIAGNOSIS — Z09 Encounter for follow-up examination after completed treatment for conditions other than malignant neoplasm: Secondary | ICD-10-CM

## 2017-01-14 DIAGNOSIS — C3411 Malignant neoplasm of upper lobe, right bronchus or lung: Secondary | ICD-10-CM

## 2017-01-14 DIAGNOSIS — C801 Malignant (primary) neoplasm, unspecified: Secondary | ICD-10-CM

## 2017-01-14 NOTE — Progress Notes (Signed)
[name]       [place] [address]             [phone] Ro[address] [phone]    HPI: [name] returns for a scheduled 3 month follow-up visit.  [name] is a 56 year old former smoker who had a thoracoscopic right upper lobectomy for stage IA adenocarcinoma on 10/30/2016. Her postoperative course was unremarkable.  I saw her in the office on 11/19/2016. She was doing well at that time. She was not having any significant incisional pain.  Since her last visit she has started pulmonary rehabilitation at [facility]. She has not smoked since her surgery. She says she has some numbness around the incision, but no pain. She has not had any respiratory issues.  Past Medical History:  Diagnosis Date  . Arthritis   . Cancer [facility])    h/o cervical  . Cancer ([place])    RULobe of the lung.Marland Kitchenadenocarcinoma per CT BX 09/03/16  . Diabetes mellitus without complication ([place])   . Ectopic pregnancy   . Gall stones   . GERD (gastroesophageal reflux disease)   . Hyperlipidemia   . Hypertension   . Hypothyroidism   . Thyroid disease    hypothyroidism    Current Outpatient Prescriptions  Medication Sig Dispense Refill  . aspirin EC 81 MG tablet Take 81 mg by mouth daily.    Marland Kitchen buPROPion (WELLBUTRIN XL) 150 MG 24 hr tablet Take 150 mg by mouth 2 (two) times daily.  2  . desloratadine (CLARINEX) 5 MG tablet Take 5 mg by mouth daily.    . ergocalciferol (VITAMIN D2) 50000 units capsule Take 50,000 Units by mouth once a week. X 8 weeks    . Ferrous Fumarate 29 MG TABS Take 1 tablet by mouth daily.    . Garlic 622 MG CAPS Take 1 capsule by mouth daily.    Marland Kitchen gemfibrozil (LOPID) 600 MG tablet Take 600 mg by mouth 2 (two) times daily before a meal.    . insulin glargine (LANTUS) 100 UNIT/ML injection Inject 20 Units into the skin daily as needed (>150).     Marland Kitchen levothyroxine (SYNTHROID, LEVOTHROID) 50 MCG tablet Take 50 mcg by mouth daily before breakfast.    . lisinopril (PRINIVIL,ZESTRIL) 20  MG tablet Take 10 mg by mouth every evening.     . meclizine (ANTIVERT) 25 MG tablet Take 25 mg by mouth 3 (three) times daily as needed for dizziness.    . metFORMIN (GLUCOPHAGE) 1000 MG tablet Take 1,000 mg by mouth 2 (two) times daily with a meal.    . NICOTINE STEP 1 21 MG/24HR patch APPLY 1 PATCH EVERY 24 HOURS AFTER REMOVING OLD PATCH  0  . Omega-3 Fatty Acids (FISH OIL) 1000 MG CAPS Take 1 capsule by mouth daily.    Marland Kitchen omeprazole (PRILOSEC) 20 MG capsule Take 20 mg by mouth 2 (two) times daily before a meal.    . oxyCODONE (OXY IR/ROXICODONE) 5 MG immediate release tablet Take 1-2 tablets (5-10 mg total) by mouth every 6 (six) hours as needed for severe pain. (Patient not taking: Reported on 01/14/2017) 30 tablet 0  . simvastatin (ZOCOR) 80 MG tablet Take 80 mg by mouth daily.    Marland Kitchen triamterene-hydrochlorothiazide (MAXZIDE-25) 37.5-25 MG tablet Take 1 tablet by mouth daily.     No current facility-administered medications for this visit.     Physical Exam BP 136/73   Pulse 80   Resp 20   Ht '6\' 1"'$  (  1.854 m)   Wt 265 lb (120.2 kg)   SpO2 99% Comment: RA  BMI 34.2 kg/m  56 year old obese woman in no acute distress Alert and oriented 3 with no focal deficits Lungs clear with equal breath sounds bilaterally Cardiac regular rate and rhythm normal S1 and S2 No cervical or supraclavicular adenopathy Incisions well healed  Diagnostic Tests: CHEST  2 VIEW  COMPARISON:  11/19/2016  FINDINGS: The heart size and mediastinal contours are within normal limits. Both lungs are clear. The visualized skeletal structures are unremarkable.  IMPRESSION: No active cardiopulmonary disease.   Electronically Signed   By: Kathreen Devoid   On: 01/14/2017 09:43 I personally reviewed the chest x-ray and concur with the findings noted above  Impression: 56 year old former smoker who had a thoracoscopic right upper lobectomy for stage IA non-small cell carcinoma in January. She is now 3  months out from surgery with no evidence of recurrent disease. She has some numbness but no pain related to her incision.  She is being followed by Dr. Bobby Rumpf in [place]. She sees him next month.  Plan:  Continue to follow-up with Dr. Bobby Rumpf and Chodri in [place]  I will be happy to see her back any time the future if I can be of any further assistance with her care  Melrose Nakayama, MD [facility] [phone]

## 2017-02-28 DIAGNOSIS — C3411 Malignant neoplasm of upper lobe, right bronchus or lung: Secondary | ICD-10-CM | POA: Diagnosis not present

## 2017-07-01 DIAGNOSIS — Z85118 Personal history of other malignant neoplasm of bronchus and lung: Secondary | ICD-10-CM | POA: Diagnosis not present

## 2017-09-18 IMAGING — CR DG CHEST 1V PORT
1 series · 1 of 1 positions shown · non-contrast
Comparison: 11/03/2016

CLINICAL DATA: Chest tube removal

EXAM:
PORTABLE CHEST 1 VIEW

[AP]
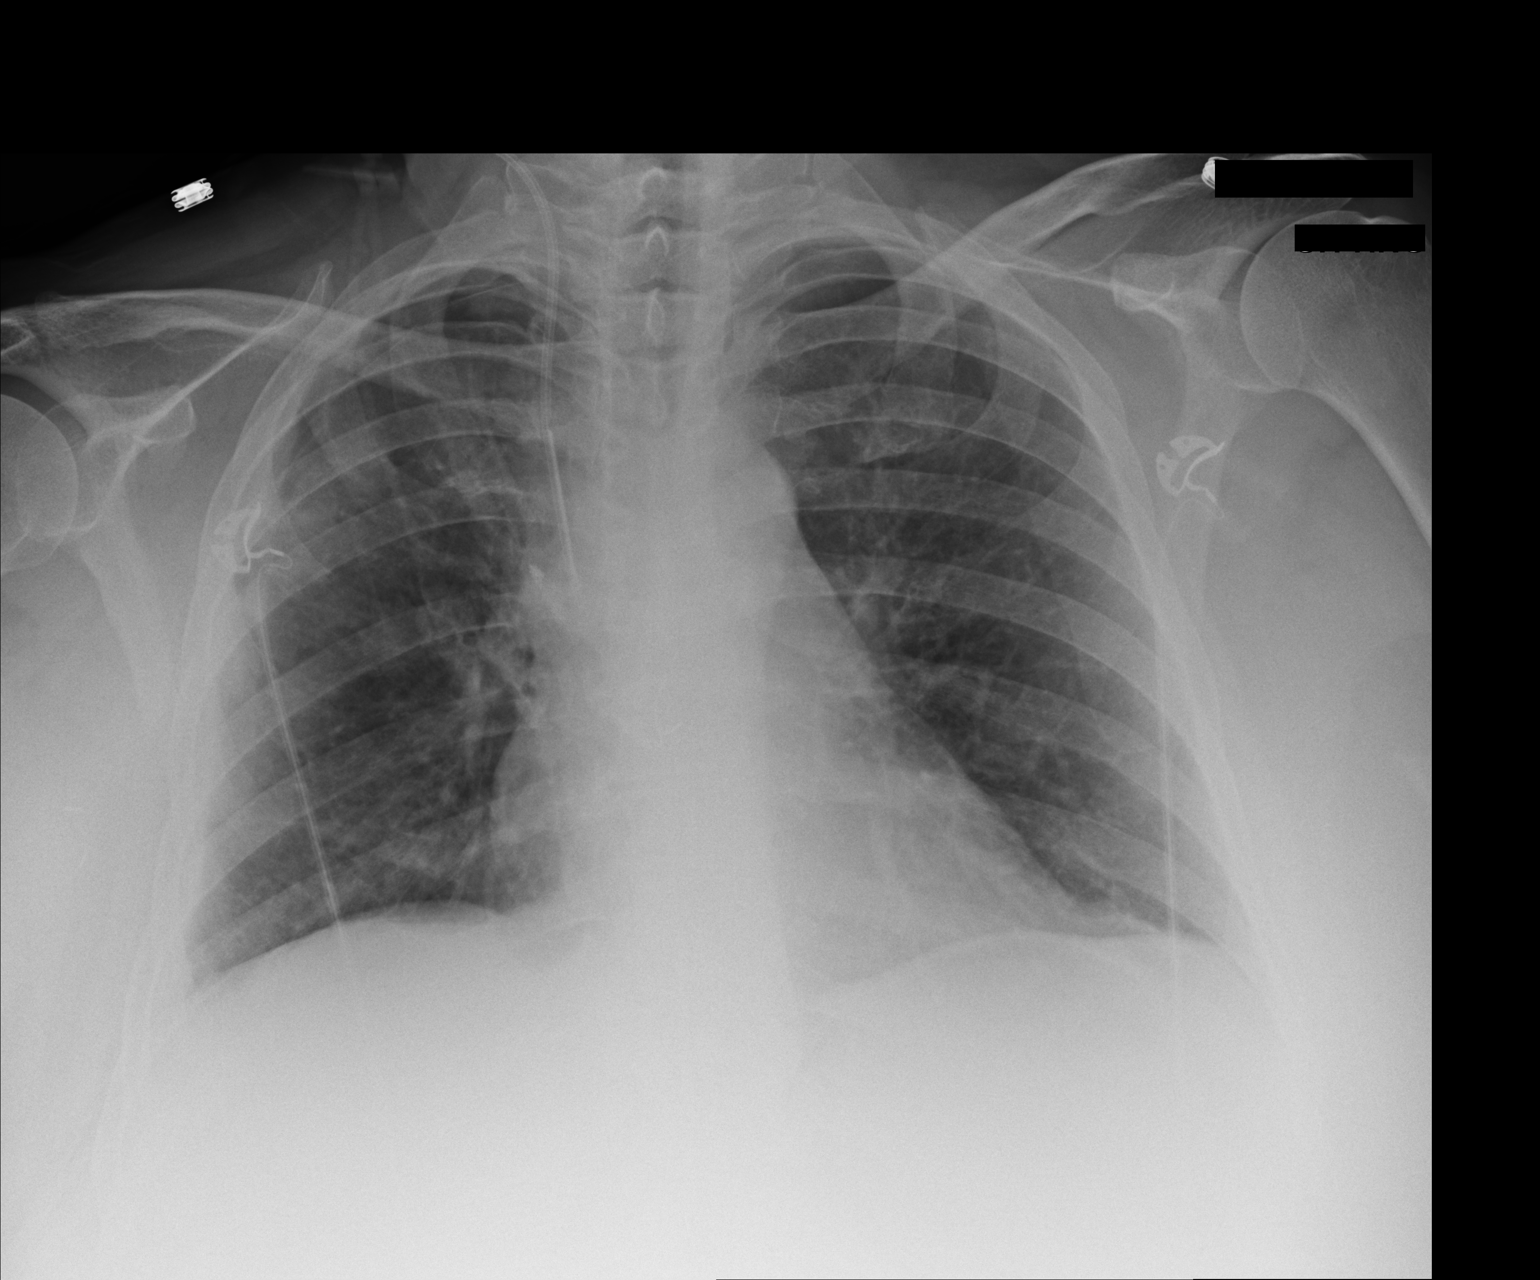

[1 of 1 positions shown; findings below may reference images not displayed]

FINDINGS: Interval removal of the right chest tube. Small right apical
pneumothorax, 5-10%, new since prior study. Low lung volumes. Right
central line is unchanged. Patchy right upper lobe opacities, likely
atelectasis.
IMPRESSION: Interval removal of right chest tube with 5-10% right apical
pneumothorax.

These results will be called to the ordering clinician or
representative by the Radiologist Assistant, and communication
documented in the PACS or zVision Dashboard.

## 2017-09-19 IMAGING — DX DG CHEST 2V
2 series · 2 of 2 positions shown · non-contrast
Comparison: Portable chest x-ray November 03, 2016

CLINICAL DATA: Status post right upper lobectomy with postoperative
pneumothorax. Chest tube removal yesterday

EXAM:
CHEST  2 VIEW

[chest pa]
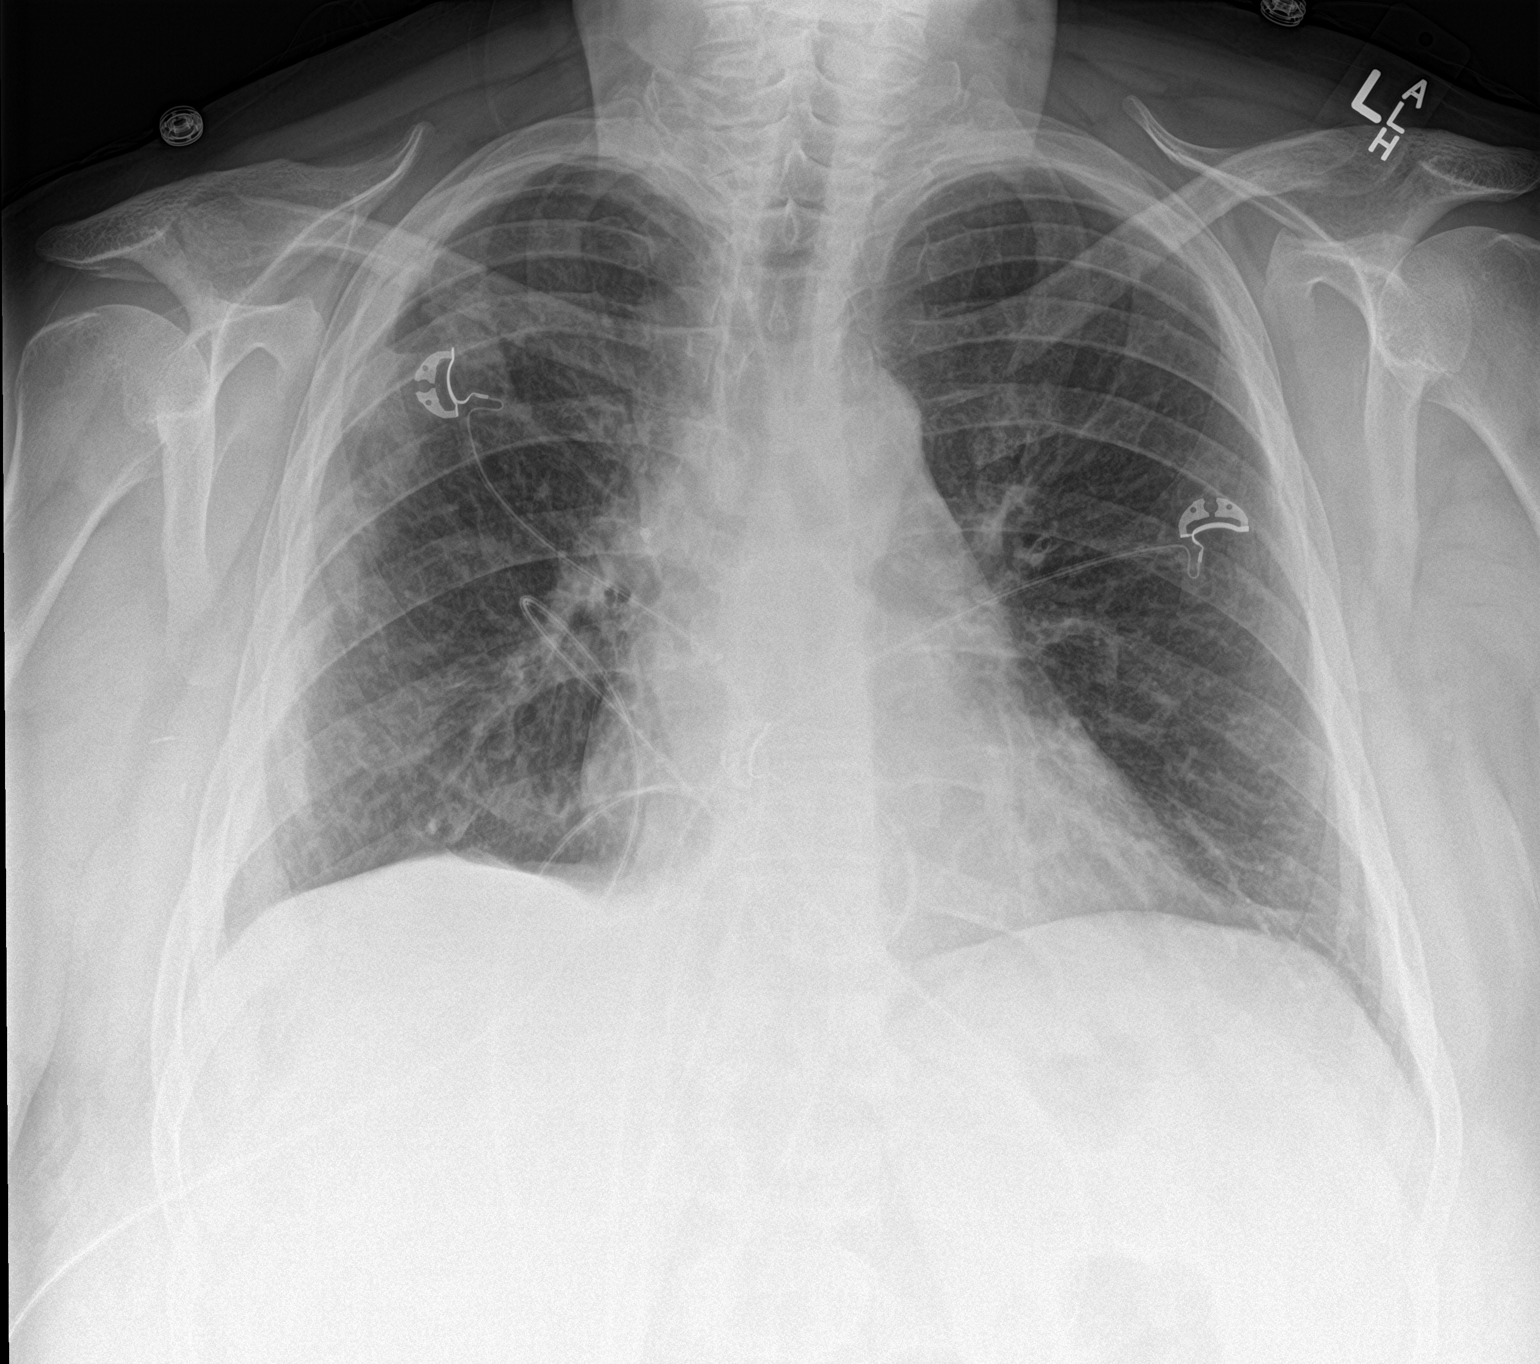

[chest lat]
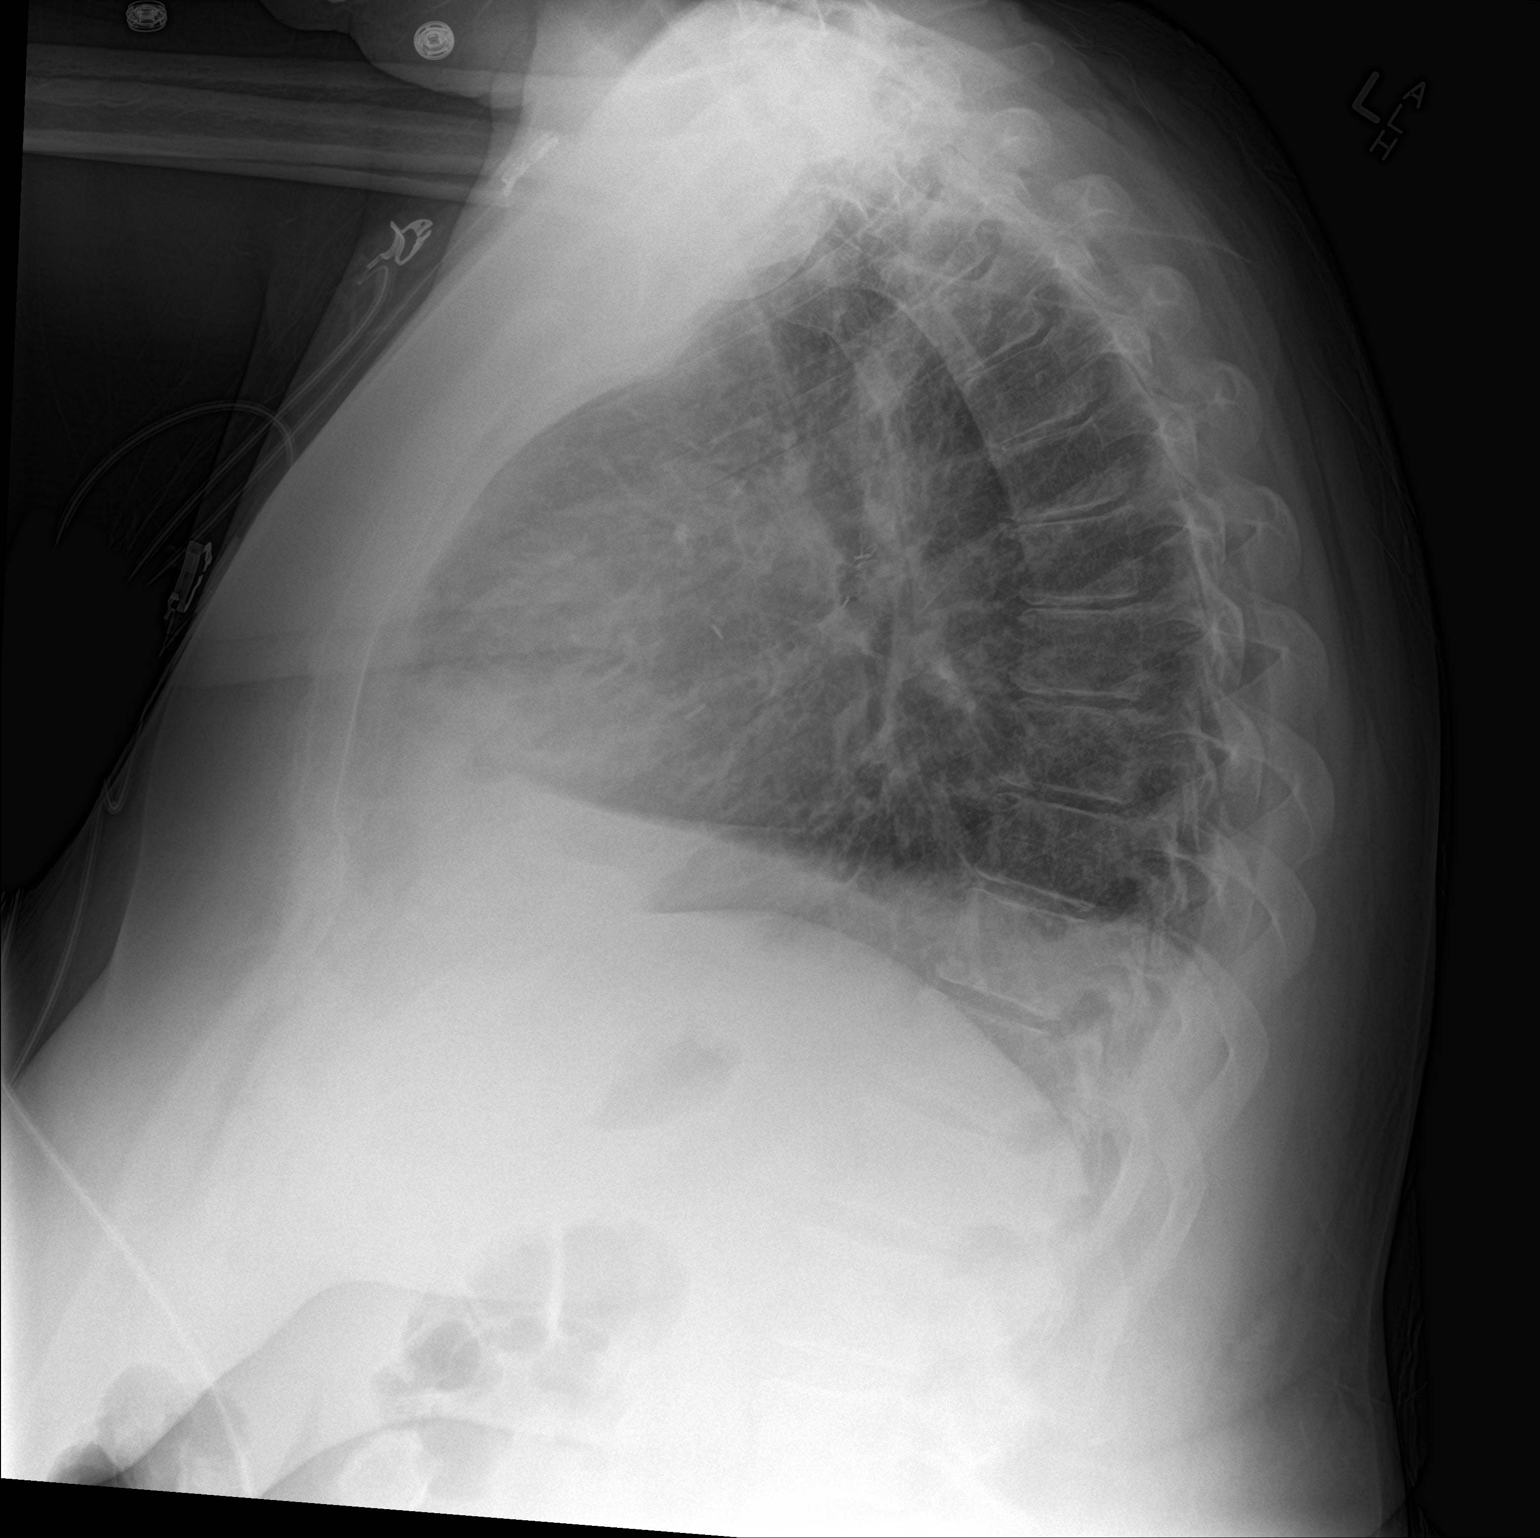

[2 of 2 positions shown; findings below may reference images not displayed]

FINDINGS: Since removal of the chest tube there is been interval increase in
the volume of the right-sided pneumothorax. It amounts to 15% of the
lung volume. There is a small right pleural effusion. There is no
mediastinal shift. The left lung is clear. The heart and pulmonary
vascularity are normal. The bony thorax exhibits no acute
abnormality.
IMPRESSION: Interval increase in the volume of the right-sided pneumothorax such
that it amounts to 15% of the lung volume.

These results were called by telephone at the time of interpretation
on 11/04/2016 at [DATE] to Cotto, RN, who verbally acknowledged
these results.

## 2017-10-04 IMAGING — DX DG CHEST 2V
2 series · 2 of 2 positions shown · non-contrast
Comparison: 11/05/2016

CLINICAL DATA: Status post right upper lobectomy for
adenocarcinoma.

EXAM:
CHEST  2 VIEW

[dg chest 2 view (1 of 2)]
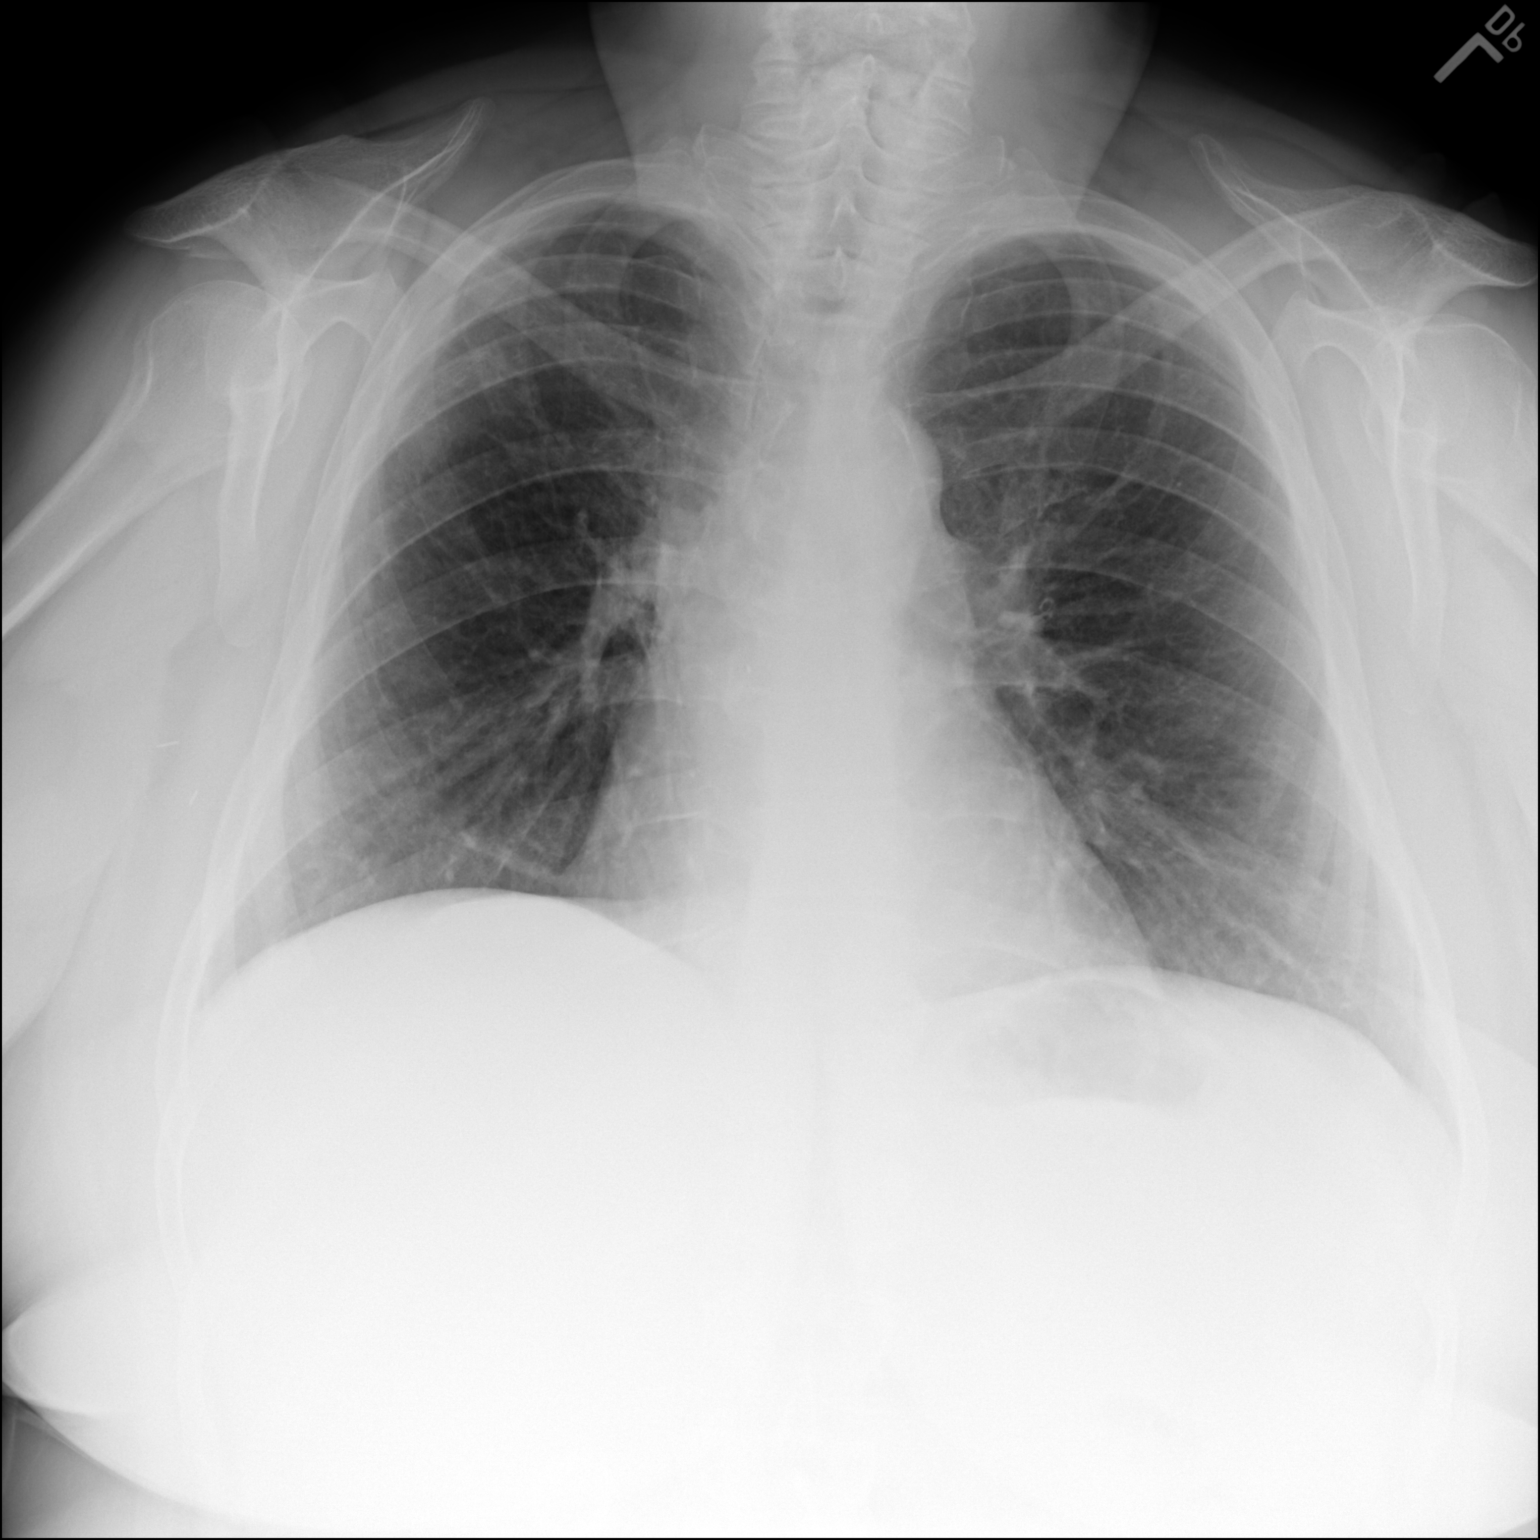

[dg chest 2 view (2 of 2)]
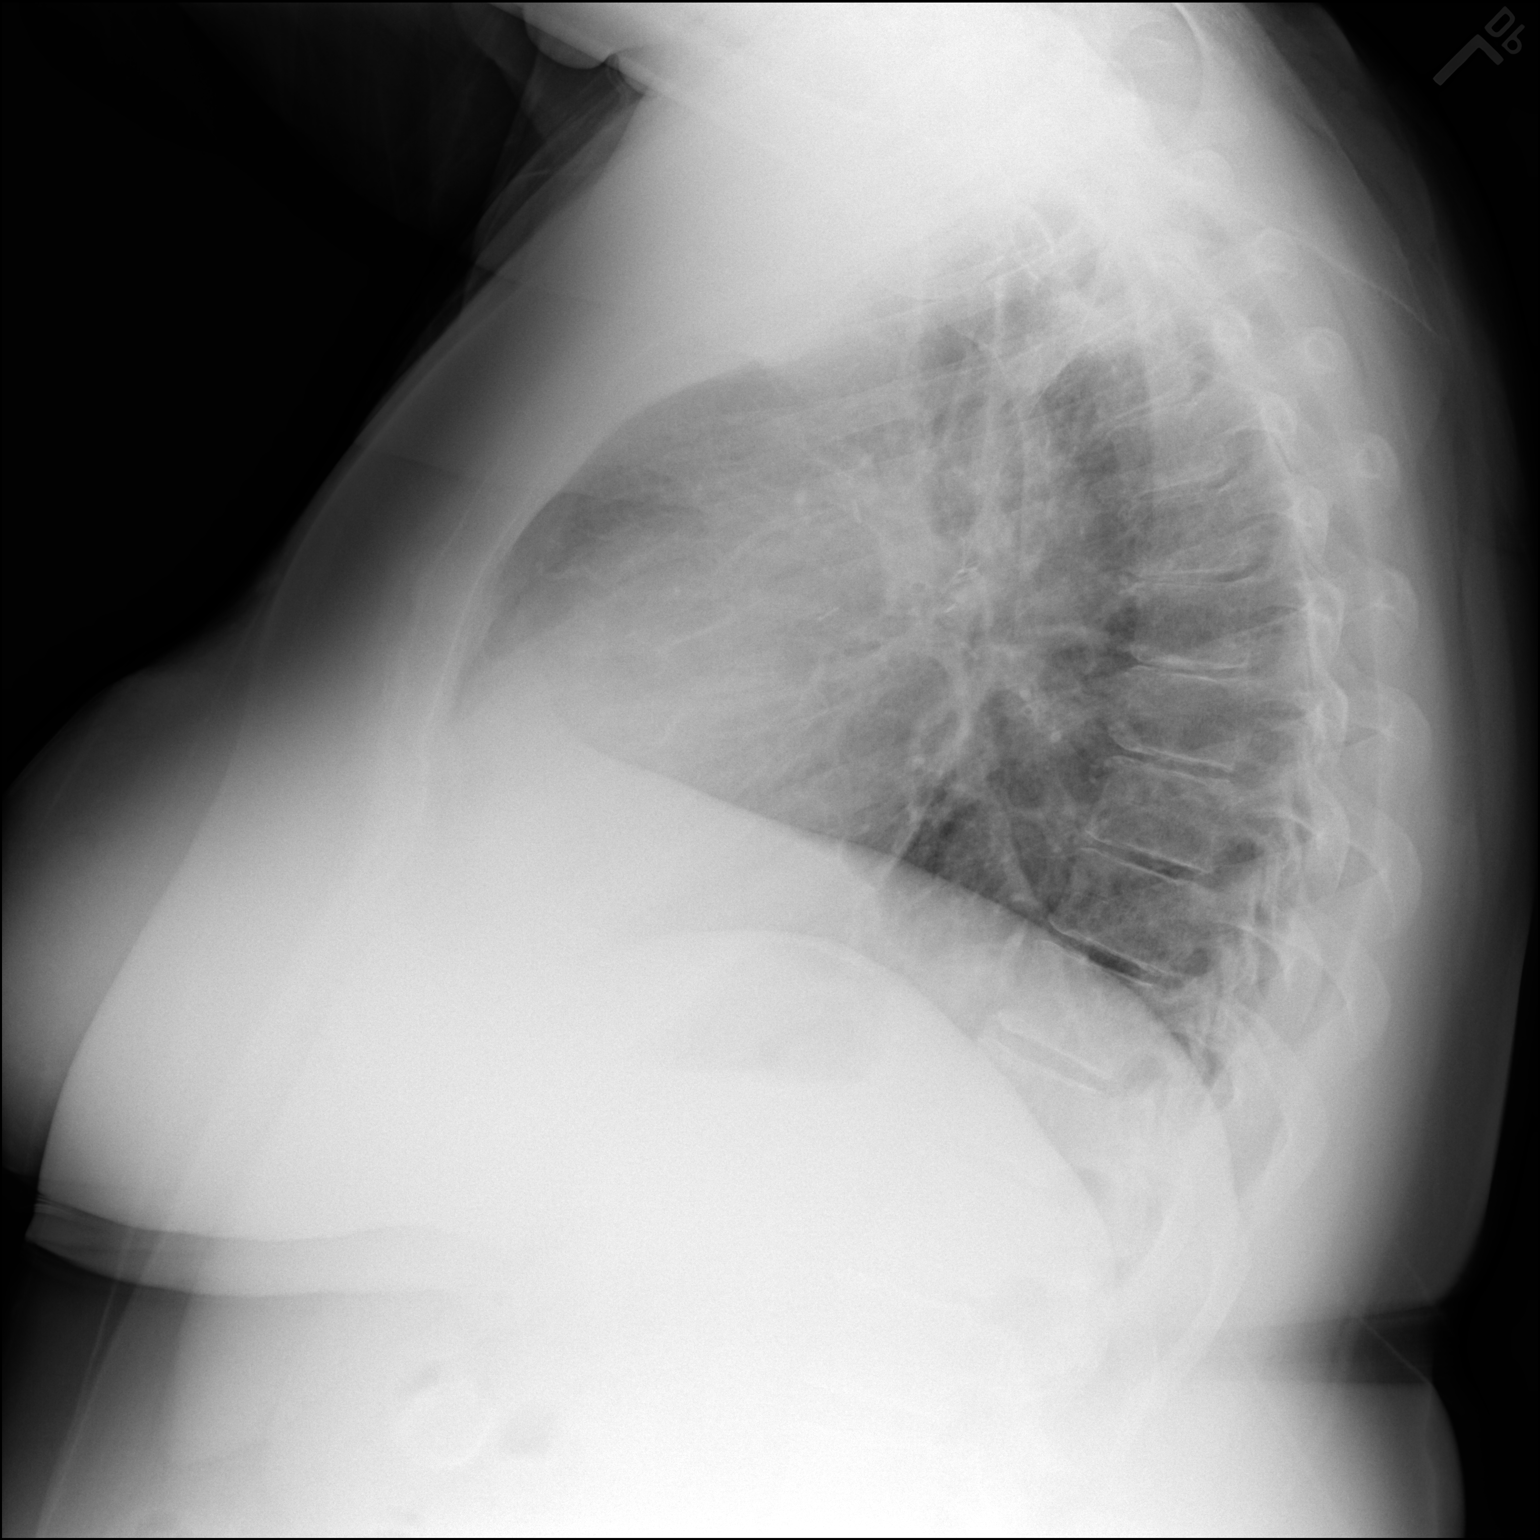

[2 of 2 positions shown; findings below may reference images not displayed]

FINDINGS: Resolved right pneumothorax. There is no evidence of pulmonary
edema, consolidation, pneumothorax, nodule or pleural fluid. The
heart size and mediastinal contours are within normal limits. Bony
structures are unremarkable.
IMPRESSION: Resolved postoperative right pneumothorax.  No acute findings.

## 2017-10-31 DIAGNOSIS — Z85118 Personal history of other malignant neoplasm of bronchus and lung: Secondary | ICD-10-CM | POA: Diagnosis not present

## 2018-02-27 DIAGNOSIS — Z85118 Personal history of other malignant neoplasm of bronchus and lung: Secondary | ICD-10-CM | POA: Diagnosis not present

## 2018-02-27 DIAGNOSIS — Z902 Acquired absence of lung [part of]: Secondary | ICD-10-CM | POA: Diagnosis not present

## 2018-07-07 DIAGNOSIS — Z902 Acquired absence of lung [part of]: Secondary | ICD-10-CM

## 2018-07-07 DIAGNOSIS — Z85118 Personal history of other malignant neoplasm of bronchus and lung: Secondary | ICD-10-CM

## 2018-11-09 DIAGNOSIS — Z85118 Personal history of other malignant neoplasm of bronchus and lung: Secondary | ICD-10-CM

## 2018-11-09 DIAGNOSIS — Z902 Acquired absence of lung [part of]: Secondary | ICD-10-CM

## 2019-05-11 DIAGNOSIS — C3411 Malignant neoplasm of upper lobe, right bronchus or lung: Secondary | ICD-10-CM | POA: Diagnosis not present

## 2019-11-11 DIAGNOSIS — C3411 Malignant neoplasm of upper lobe, right bronchus or lung: Secondary | ICD-10-CM | POA: Diagnosis not present

## 2020-06-09 DIAGNOSIS — C3411 Malignant neoplasm of upper lobe, right bronchus or lung: Secondary | ICD-10-CM

## 2020-09-25 ENCOUNTER — Encounter: Payer: Self-pay | Admitting: Endocrinology

## 2020-11-23 ENCOUNTER — Ambulatory Visit: Payer: 59 | Admitting: Endocrinology

## 2020-12-06 DIAGNOSIS — C3411 Malignant neoplasm of upper lobe, right bronchus or lung: Secondary | ICD-10-CM | POA: Insufficient documentation

## 2020-12-06 NOTE — Progress Notes (Signed)
[facility]  [address] [place],  [place]  [address] [phone]  Clinic Day:  12/07/2020  Referring physician: Ailene Rud, NP   HISTORY OF PRESENT ILLNESS:  The patient is a 60 y.o. female with stage IA3 (T1c N0 M0) lung adenocarcinoma, status post a right upper lobe segmental resection in January 2018.  As adjuvant chemotherapy was not warranted, the patient has since been followed conservatively.  She comes in today to review her most recent chest x-ray to ascertain her new disease baseline.  Since her last visit, the patient has been doing well.  The patient has not smoked since her lung cancer surgery.  She denies having any shortness of breath, hemoptysis, or other respiratory symptoms which concern her for disease recurrence.   PHYSICAL EXAM:  Blood pressure (!) 147/80, pulse 76, temperature 98.2 F (36.8 C), resp. rate 16, height 5\' 10"  (1.778 m), weight 256 lb 3.2 oz (116.2 kg), SpO2 99 %. Wt Readings from Last 3 Encounters:  12/07/20 256 lb 3.2 oz (116.2 kg)  01/14/17 265 lb (120.2 kg)  11/19/16 266 lb (120.7 kg)   Body mass index is 36.76 kg/m. Performance status (ECOG): 1 Physical Exam Constitutional:      Appearance: Normal appearance. She is not ill-appearing.  HENT:     Mouth/Throat:     Mouth: Mucous membranes are moist.     Pharynx: Oropharynx is clear. No oropharyngeal exudate or posterior oropharyngeal erythema.  Cardiovascular:     Rate and Rhythm: Normal rate and regular rhythm.     Heart sounds: No murmur heard. No friction rub. No gallop.   Pulmonary:     Effort: Pulmonary effort is normal. No respiratory distress.     Breath sounds: Normal breath sounds. No wheezing, rhonchi or rales.  Chest:  Breasts:     Right: No axillary adenopathy or supraclavicular adenopathy.     Left: No axillary adenopathy or supraclavicular adenopathy.    Abdominal:     General: Bowel sounds are normal. There is no distension.      Palpations: Abdomen is soft. There is no mass.     Tenderness: There is no abdominal tenderness.  Musculoskeletal:        General: No swelling.     Right lower leg: No edema.     Left lower leg: No edema.  Lymphadenopathy:     Cervical: No cervical adenopathy.     Upper Body:     Right upper body: No supraclavicular or axillary adenopathy.     Left upper body: No supraclavicular or axillary adenopathy.     Lower Body: No right inguinal adenopathy. No left inguinal adenopathy.  Skin:    General: Skin is warm.     Coloration: Skin is not jaundiced.     Findings: No lesion or rash.  Neurological:     General: No focal deficit present.     Mental Status: She is alert and oriented to person, place, and time. Mental status is at baseline.     Cranial Nerves: Cranial nerves are intact.  Psychiatric:        Mood and Affect: Mood normal.        Behavior: Behavior normal.        Thought Content: Thought content normal.     LABS:   CBC Latest Ref Rng & Units 12/07/2020 11/03/2016 11/01/2016  WBC - 7.2 8.3 10.5  Hemoglobin 12.0 - 16.0 11.0(A) 9.7(L) 9.5(L)  Hematocrit 36 - 46 33(A)  29.1(L) 28.5(L)  Platelets 150 - 399 204 286 224   CMP Latest Ref Rng & Units 12/07/2020 11/03/2016 11/01/2016  Glucose 65 - 99 mg/dL - 139(H) 154(H)  BUN 4 - 21 21 22(H) 19  Creatinine 0.5 - 1.1 1.4(A) 1.02(H) 1.01(H)  Sodium 137 - 147 142 137 133(L)  Potassium 3.4 - 5.3 4.4 4.0 4.0  Chloride 99 - 108 108 99(L) 99(L)  CO2 13 - 22 23(A) 26 27  Calcium 8.7 - 10.7 9.8 9.6 9.0  Total Protein 6.5 - 8.1 g/dL - - 6.2(L)  Total Bilirubin 0.3 - 1.2 mg/dL - - 0.5  Alkaline Phos 25 - 125 81 - 50  AST 13 - 35 52(A) - 24  ALT 7 - 35 30 - 18    STUDIES:  Her chest x-ray revealed the following:: The official report of her x-ray is pending.  From my vantage point, there no nodules, effusions or other pulmonary findings that are worrisome for disease recurrence.  ASSESSMENT & PLAN:  Assessment/Plan:  A 60 y.o. female  with stage IA3 (T1c N0 M0) lung adenocarcinoma, status post a right upper lobe segmental resection in January 2018.  In clinic today, I went over her chest x-ray images with her, for which she could appreciate that she remains disease-free.  Clinically, the patient continues to do very well.  I will see her back in another 6 months, with a chest x-ray being done on the day of her next visit for her continued radiographic lung cancer surveillance.  The patient understands all the plans discussed today and is in agreement with them.    Raven Bohlen Macarthur Critchley, MD

## 2020-12-07 ENCOUNTER — Telehealth: Payer: Self-pay | Admitting: Oncology

## 2020-12-07 ENCOUNTER — Other Ambulatory Visit: Payer: Self-pay | Admitting: Oncology

## 2020-12-07 ENCOUNTER — Other Ambulatory Visit: Payer: Self-pay | Admitting: Hematology and Oncology

## 2020-12-07 ENCOUNTER — Other Ambulatory Visit: Payer: Self-pay

## 2020-12-07 ENCOUNTER — Inpatient Hospital Stay (INDEPENDENT_AMBULATORY_CARE_PROVIDER_SITE_OTHER): Payer: 59 | Admitting: Oncology

## 2020-12-07 ENCOUNTER — Inpatient Hospital Stay: Payer: 59 | Attending: Oncology

## 2020-12-07 ENCOUNTER — Encounter: Payer: Self-pay | Admitting: Oncology

## 2020-12-07 DIAGNOSIS — C3490 Malignant neoplasm of unspecified part of unspecified bronchus or lung: Secondary | ICD-10-CM

## 2020-12-07 DIAGNOSIS — C3411 Malignant neoplasm of upper lobe, right bronchus or lung: Secondary | ICD-10-CM

## 2020-12-07 LAB — BASIC METABOLIC PANEL
BUN: 21 (ref 4–21)
CO2: 23 — AB (ref 13–22)
Chloride: 108 (ref 99–108)
Creatinine: 1.4 — AB (ref 0.5–1.1)
Glucose: 92
Potassium: 4.4 (ref 3.4–5.3)
Sodium: 142 (ref 137–147)

## 2020-12-07 LAB — CBC AND DIFFERENTIAL
HCT: 33 — AB (ref 36–46)
Hemoglobin: 11 — AB (ref 12.0–16.0)
Neutrophils Absolute: 3.46
Platelets: 204 (ref 150–399)
WBC: 7.2

## 2020-12-07 LAB — HEPATIC FUNCTION PANEL
ALT: 30 (ref 7–35)
AST: 52 — AB (ref 13–35)
Alkaline Phosphatase: 81 (ref 25–125)
Bilirubin, Total: 0.5

## 2020-12-07 LAB — COMPREHENSIVE METABOLIC PANEL
Albumin: 4.7 (ref 3.5–5.0)
Calcium: 9.8 (ref 8.7–10.7)

## 2020-12-07 LAB — CBC
MCV: 93 (ref 81–99)
RBC: 35.3 — AB (ref 3.87–5.11)

## 2020-12-07 NOTE — Telephone Encounter (Signed)
Per 3/3 los next appt sched and given to patient

## 2020-12-21 ENCOUNTER — Ambulatory Visit: Payer: 59 | Admitting: Endocrinology

## 2020-12-21 ENCOUNTER — Other Ambulatory Visit: Payer: Self-pay

## 2020-12-21 VITALS — BP 170/78 | HR 87 | Ht 73.0 in | Wt 259.8 lb

## 2020-12-21 DIAGNOSIS — E119 Type 2 diabetes mellitus without complications: Secondary | ICD-10-CM

## 2020-12-21 LAB — POCT GLYCOSYLATED HEMOGLOBIN (HGB A1C): Hemoglobin A1C: 6.7 % — AB (ref 4.0–5.6)

## 2020-12-21 MED ORDER — SEMGLEE 100 UNIT/ML ~~LOC~~ SOPN: 35.0000 [IU] | PEN_INJECTOR | Freq: Every day | SUBCUTANEOUS | 11 refills | Status: DC

## 2020-12-21 NOTE — Progress Notes (Signed)
Subjective:    Patient ID: [name], female    DOB: [birth year], 60 y.o.   MRN: [id number]  HPI pt is referred by Bobbye Riggs, NP, for diabetes.  Pt states DM was dx'ed in 3762; it is complicated by stage 3 CRI; she has been on insulin since soon after dx; pt says her diet and exercise are not good; she has never had GDM, pancreatitis, pancreatic surgery, severe hypoglycemia or DKA.  She works indust mfg, 3rd shift.  Pt says she cannot afford current meds.  cbg's vary from 104-250.  It is in general higher PC than AC.  She takes Lantus 20-25 units qd, and PRN Novolog (averages approx. 5 units per day).   Past Medical History:  Diagnosis Date   Arthritis    Cancer [facility])    h/o cervical   Cancer ([place])    RULobe of the lung.Marland Kitchenadenocarcinoma per CT BX 09/03/16   Diabetes mellitus without complication ([facility])    Ectopic pregnancy    Gall stones    GERD (gastroesophageal reflux disease)    Hyperlipidemia    Hypertension    Hypothyroidism    Thyroid disease    hypothyroidism    Past Surgical History:  Procedure Laterality Date   dilatation and curettage     ENDOMETRIAL ABLATION     resection of the left oviduct and ovary     TUBAL LIGATION     VIDEO ASSISTED THORACOSCOPY (VATS)/ LOBECTOMY Right 10/30/2016   Procedure: VIDEO ASSISTED THORACOSCOPY (VATS)/RIGHT UPPER LOBECTOMY;  Surgeon: Melrose Nakayama, MD;  Location: [facility] OR;  Service: Thoracic;  Laterality: Right;    Social History   Socioeconomic History   Marital status: Single    Spouse name: Not on file   Number of children: Not on file   Years of education: Not on file   Highest education level: Not on file  Occupational History   Not on file  Tobacco Use   Smoking status: Former Smoker    Packs/day: 0.00    Years: 44.00    Pack years: 0.00    Quit date: 10/25/2016    Years since quitting: 4.1   Smokeless tobacco: Never Used   Tobacco comment: BARELY A  PACK  Substance  and Sexual Activity   Alcohol use: No   Drug use: No   Sexual activity: Not on file  Other Topics Concern   Not on file  Social History Narrative   Not on file   Social Determinants of Health   Financial Resource Strain: Not on file  Food Insecurity: Not on file  Transportation Needs: Not on file  Physical Activity: Not on file  Stress: Not on file  Social Connections: Not on file  Intimate Partner Violence: Not on file    Current Outpatient Medications on File Prior to Visit  Medication Sig Dispense Refill   aspirin EC 81 MG tablet Take 81 mg by mouth daily.     buPROPion (WELLBUTRIN XL) 150 MG 24 hr tablet Take 150 mg by mouth 2 (two) times daily.  2   Garlic 831 MG CAPS Take 1 capsule by mouth daily.     gemfibrozil (LOPID) 600 MG tablet Take 600 mg by mouth 2 (two) times daily before a meal.     levothyroxine (SYNTHROID, LEVOTHROID) 50 MCG tablet Take 50 mcg by mouth daily before breakfast.     lisinopril (PRINIVIL,ZESTRIL) 20 MG tablet Take 10 mg by mouth every evening.  metFORMIN (GLUCOPHAGE) 1000 MG tablet Take 1,000 mg by mouth 2 (two) times daily with a meal.     Omega-3 Fatty Acids (FISH OIL) 1000 MG CAPS Take 1 capsule by mouth daily.     simvastatin (ZOCOR) 80 MG tablet Take 80 mg by mouth daily.     desloratadine (CLARINEX) 5 MG tablet Take 5 mg by mouth daily.     ergocalciferol (VITAMIN D2) 50000 units capsule Take 50,000 Units by mouth once a week. X 8 weeks     Ferrous Fumarate 29 MG TABS Take 1 tablet by mouth daily.     meclizine (ANTIVERT) 25 MG tablet Take 25 mg by mouth 3 (three) times daily as needed for dizziness.     NICOTINE STEP 1 21 MG/24HR patch APPLY 1 PATCH EVERY 24 HOURS AFTER REMOVING OLD PATCH  0   omeprazole (PRILOSEC) 20 MG capsule Take 20 mg by mouth 2 (two) times daily before a meal.     oxyCODONE (OXY IR/ROXICODONE) 5 MG immediate release tablet Take 1-2 tablets (5-10 mg total) by mouth every 6 (six) hours as needed  for severe pain. (Patient not taking: Reported on 01/14/2017) 30 tablet 0   triamterene-hydrochlorothiazide (MAXZIDE-25) 37.5-25 MG tablet Take 1 tablet by mouth daily.     No current facility-administered medications on file prior to visit.    Allergies  Allergen Reactions   No Known Allergies     Family History  Problem Relation Age of Onset   Heart disease Mother    Heart disease Father    Diabetes Sister    Hypertension Sister    Cancer Maternal Aunt        BREAST CANCER    BP (!) 170/78 (BP Location: Right Arm, Patient Position: Sitting, Cuff Size: Large)    Pulse 87    Ht 6\' 1"  (1.854 m)    Wt 259 lb 12.8 oz (117.8 kg)    SpO2 98%    BMI 34.28 kg/m     Review of Systems denies weight loss, sob, n/v, hypoglycemia, and depression.      Objective:   Physical Exam VITAL SIGNS:  See vs page GENERAL: no distress Pulses: dorsalis pedis intact bilat.   MSK: no deformity of the feet CV: 2+ bilat leg edema, and bilat vv's Skin:  no ulcer on the feet.  normal color and temp on the feet. Neuro: sensation is intact to touch on the feet. Ext: there is bilateral onychomycosis of the toenails.    Lab Results  Component Value Date   HGBA1C 6.7 (A) 12/21/2020   I have reviewed outside records, and summarized: Pt was noted to have elevated A1c, and referred here.  Pt was encouraged to redouble diet and exercise efforts.        Assessment & Plan:  Insulin-requiring type 2 DM, with stage 3 CRI.  therapy limited by pt's request for least expensive meds.  She also requests to d/c multiple daily injections.    Patient Instructions  Your blood pressure is high today.  Please see your primary care provider soon, to have it rechecked good diet and exercise significantly improve the control of your diabetes.  please let me know if you wish to be referred to a dietician.  high blood sugar is very risky to your health.  you should see an eye doctor and dentist every year.  It is  very important to get all recommended vaccinations.  Controlling your blood pressure and cholesterol drastically reduces the damage  diabetes does to your body.  Those who smoke should quit.  Please discuss these with your doctor.  check your blood sugar twice a day.  vary the time of day when you check, between before the 3 meals, and at bedtime.  also check if you have symptoms of your blood sugar being too high or too low.  please keep a record of the readings and bring it to your next appointment here (or you can bring the meter itself).  You can write it on any piece of paper.  please call us sooner if your blood sugar goes below 70, or if most of your readings are over 200. We will need to take this complex situation in stages For now, please stop taking the glipizide and Novolog, and: Increase the Lantus to 35 units daily, and: Please continue the same metformin. Please come back for a follow-up appointment in 2 months.

## 2020-12-21 NOTE — Patient Instructions (Addendum)
Your blood pressure is high today.  Please see your primary care provider soon, to have it rechecked good diet and exercise significantly improve the control of your diabetes.  please let me know if you wish to be referred to a dietician.  high blood sugar is very risky to your health.  you should see an eye doctor and dentist every year.  It is very important to get all recommended vaccinations.  Controlling your blood pressure and cholesterol drastically reduces the damage diabetes does to your body.  Those who smoke should quit.  Please discuss these with your doctor.  check your blood sugar twice a day.  vary the time of day when you check, between before the 3 meals, and at bedtime.  also check if you have symptoms of your blood sugar being too high or too low.  please keep a record of the readings and bring it to your next appointment here (or you can bring the meter itself).  You can write it on any piece of paper.  please call us sooner if your blood sugar goes below 70, or if most of your readings are over 200. We will need to take this complex situation in stages For now, please stop taking the glipizide and Novolog, and: Increase the Lantus to 35 units daily, and: Please continue the same metformin. Please come back for a follow-up appointment in 2 months.

## 2021-02-20 ENCOUNTER — Other Ambulatory Visit: Payer: Self-pay

## 2021-02-20 ENCOUNTER — Ambulatory Visit (INDEPENDENT_AMBULATORY_CARE_PROVIDER_SITE_OTHER): Payer: 59 | Admitting: Endocrinology

## 2021-02-20 VITALS — BP 120/70 | HR 80 | Ht 73.0 in | Wt 257.6 lb

## 2021-02-20 DIAGNOSIS — E119 Type 2 diabetes mellitus without complications: Secondary | ICD-10-CM | POA: Diagnosis not present

## 2021-02-20 LAB — POCT GLYCOSYLATED HEMOGLOBIN (HGB A1C): Hemoglobin A1C: 7.6 % — AB (ref 4.0–5.6)

## 2021-02-20 MED ORDER — SEMGLEE 100 UNIT/ML ~~LOC~~ SOPN: 20.0000 [IU] | PEN_INJECTOR | Freq: Every day | SUBCUTANEOUS | 11 refills | Status: DC

## 2021-02-20 MED ORDER — METFORMIN HCL 1000 MG PO TABS: 1000.0000 mg | ORAL_TABLET | Freq: Every day | ORAL | 3 refills | Status: DC

## 2021-02-20 MED ORDER — OZEMPIC (0.25 OR 0.5 MG/DOSE) 2 MG/1.5ML ~~LOC~~ SOPN: 0.5000 mg | PEN_INJECTOR | SUBCUTANEOUS | 3 refills | Status: DC

## 2021-02-20 NOTE — Patient Instructions (Addendum)
Your blood pressure is high today.  Please see your primary care provider soon, to have it rechecked check your blood sugar twice a day.  vary the time of day when you check, between before the 3 meals, and at bedtime.  also check if you have symptoms of your blood sugar being too high or too low.  please keep a record of the readings and bring it to your next appointment here (or you can bring the meter itself).  You can write it on any piece of paper.  please call us sooner if your blood sugar goes below 70, or if most of your readings are over 200. We will need to take this complex situation in stages I have sent a prescription to your pharmacy, to add Ozempic.  For the first 1-2 shots, take just 0.25 mg Reduce the Semglee to 20 units daily, and:  Please reduce the same metformin to just 1 pill per day.  Please come back for a follow-up appointment in 2 months.

## 2021-02-20 NOTE — Progress Notes (Signed)
Subjective:    Patient ID: [name], female    DOB: Feb 13, 1961, 60 y.o.   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  Other: she requested to d/c multiple daily injections.   Interval history: no cbg record, but states cbg's vary from 80-188.  pt states she feels well in general.   Past Medical History:  Diagnosis Date  . Arthritis   . Cancer [facility])    h/o cervical  . Cancer ([place])    RULobe of the lung.Marland Kitchenadenocarcinoma per CT BX 09/03/16  . Diabetes mellitus without complication ([place])   . Ectopic pregnancy   . Gall stones   . GERD (gastroesophageal reflux disease)   . Hyperlipidemia   . Hypertension   . Hypothyroidism   . Thyroid disease    hypothyroidism    Past Surgical History:  Procedure Laterality Date  . dilatation and curettage    . ENDOMETRIAL ABLATION    . resection of the left oviduct and ovary    . TUBAL LIGATION    . VIDEO ASSISTED THORACOSCOPY (VATS)/ LOBECTOMY Right 10/30/2016   Procedure: VIDEO ASSISTED THORACOSCOPY (VATS)/RIGHT UPPER LOBECTOMY;  Surgeon: Melrose Nakayama, MD;  Location: [facility];  Service: Thoracic;  Laterality: Right;    Social History   Socioeconomic History  . Marital status: Single    Spouse name: Not on file  . Number of children: Not on file  . Years of education: Not on file  . Highest education level: Not on file  Occupational History  . Not on file  Tobacco Use  . Smoking status: Former Smoker    Packs/day: 0.00    Years: 44.00    Pack years: 0.00    Quit date: 10/25/2016    Years since quitting: 4.3  . Smokeless tobacco: Never Used  . Tobacco comment: BARELY A  PACK  Substance and Sexual Activity  . Alcohol use: No  . Drug use: No  . Sexual activity:  Not on file  Other Topics Concern  . Not on file  Social History Narrative  . Not on file   Social Determinants of Health   Financial Resource Strain: Not on file  Food Insecurity: Not on file  Transportation Needs: Not on file  Physical Activity: Not on file  Stress: Not on file  Social Connections: Not on file  Intimate Partner Violence: Not on file    Current Outpatient Medications on File Prior to Visit  Medication Sig Dispense Refill  . aspirin EC 81 MG tablet Take 81 mg by mouth daily.    Marland Kitchen buPROPion (WELLBUTRIN XL) 150 MG 24 hr tablet Take 150 mg by mouth 2 (two) times daily.  2  . Garlic 440 MG CAPS Take 1 capsule by mouth daily.    Marland Kitchen gemfibrozil (LOPID) 600 MG tablet Take 600 mg by mouth 2 (two) times daily before a meal.    . levothyroxine (SYNTHROID, LEVOTHROID) 50 MCG tablet Take 50 mcg by mouth daily before breakfast.    . lisinopril (PRINIVIL,ZESTRIL) 20 MG tablet Take 10 mg by mouth every evening.     . Omega-3 Fatty Acids (FISH OIL) 1000 MG CAPS Take 1 capsule by mouth daily.    . simvastatin (ZOCOR) 80 MG tablet Take 80 mg by  mouth daily.    Marland Kitchen desloratadine (CLARINEX) 5 MG tablet Take 5 mg by mouth daily.    . ergocalciferol (VITAMIN D2) 50000 units capsule Take 50,000 Units by mouth once a week. X 8 weeks    . Ferrous Fumarate 29 MG TABS Take 1 tablet by mouth daily.    . meclizine (ANTIVERT) 25 MG tablet Take 25 mg by mouth 3 (three) times daily as needed for dizziness.    Marland Kitchen NICOTINE STEP 1 21 MG/24HR patch APPLY 1 PATCH EVERY 24 HOURS AFTER REMOVING OLD PATCH  0  . omeprazole (PRILOSEC) 20 MG capsule Take 20 mg by mouth 2 (two) times daily before a meal.    . oxyCODONE (OXY IR/ROXICODONE) 5 MG immediate release tablet Take 1-2 tablets (5-10 mg total) by mouth every 6 (six) hours as needed for severe pain. (Patient not taking: Reported on 01/14/2017) 30 tablet 0  . triamterene-hydrochlorothiazide (MAXZIDE-25) 37.5-25 MG tablet Take 1 tablet by mouth daily.     No  current facility-administered medications on file prior to visit.    Allergies  Allergen Reactions  . No Known Allergies     Family History  Problem Relation Age of Onset  . Heart disease Mother   . Heart disease Father   . Diabetes Sister   . Hypertension Sister   . Cancer Maternal Aunt        BREAST CANCER    BP 120/70 (BP Location: Right Arm, Patient Position: Sitting, Cuff Size: Large)   Pulse 80   Ht 6\' 1"  (1.854 m)   Wt 257 lb 9.6 oz (116.8 kg)   SpO2 94%   BMI 33.99 kg/m    Review of Systems She denies hypoglycemia.      Objective:   Physical Exam VITAL SIGNS:  See vs page GENERAL: no distress Pulses: dorsalis pedis intact bilat.   MSK: no deformity of the feet CV: 1+ bilat leg edema, and bilat vv's.   Skin:  no ulcer on the feet.  normal color and temp on the feet.   Neuro: sensation is intact to touch on the feet.  Ext: there is bilateral onychomycosis of the toenails.    A1c=7.6%     Assessment & Plan:  Insulin-requiring type 2 DM: uncontrolled.  We discussed.  She wants to see how expensive Ozempic is.    Patient Instructions  Your blood pressure is high today.  Please see your primary care provider soon, to have it rechecked check your blood sugar twice a day.  vary the time of day when you check, between before the 3 meals, and at bedtime.  also check if you have symptoms of your blood sugar being too high or too low.  please keep a record of the readings and bring it to your next appointment here (or you can bring the meter itself).  You can write it on any piece of paper.  please call us sooner if your blood sugar goes below 70, or if most of your readings are over 200. We will need to take this complex situation in stages I have sent a prescription to your pharmacy, to add Ozempic.  For the first 1-2 shots, take just 0.25 mg Reduce the Semglee to 20 units daily, and:  Please reduce the same metformin to just 1 pill per day.  Please come back for a  follow-up appointment in 2 months.

## 2021-02-22 ENCOUNTER — Telehealth: Payer: Self-pay

## 2021-02-22 NOTE — Telephone Encounter (Signed)
PA for Ozempic Case: [id number], Status: Approved, Coverage Starts on: 02/22/2021 12:00:00 AM, Coverage Ends on: 02/22/2022 12:00:00 AM.A and pt has been notified.

## 2021-02-26 ENCOUNTER — Telehealth: Payer: Self-pay

## 2021-02-26 NOTE — Telephone Encounter (Signed)
PA for Ozempic has been approved thru 02/22/2022

## 2021-04-27 ENCOUNTER — Ambulatory Visit: Payer: 59 | Admitting: Endocrinology

## 2021-05-21 ENCOUNTER — Ambulatory Visit (INDEPENDENT_AMBULATORY_CARE_PROVIDER_SITE_OTHER): Payer: 59 | Admitting: Endocrinology

## 2021-05-21 ENCOUNTER — Telehealth: Payer: Self-pay | Admitting: Pharmacy Technician

## 2021-05-21 ENCOUNTER — Other Ambulatory Visit: Payer: Self-pay

## 2021-05-21 VITALS — BP 130/70 | HR 72 | Ht 73.0 in | Wt 243.6 lb

## 2021-05-21 DIAGNOSIS — E119 Type 2 diabetes mellitus without complications: Secondary | ICD-10-CM | POA: Diagnosis not present

## 2021-05-21 LAB — POCT GLYCOSYLATED HEMOGLOBIN (HGB A1C): Hemoglobin A1C: 7.1 % — AB (ref 4.0–5.6)

## 2021-05-21 MED ORDER — SEMAGLUTIDE (1 MG/DOSE) 4 MG/3ML ~~LOC~~ SOPN: 1.0000 mg | PEN_INJECTOR | SUBCUTANEOUS | 3 refills | Status: DC

## 2021-05-21 MED ORDER — SEMGLEE 100 UNIT/ML ~~LOC~~ SOPN: 10.0000 [IU] | PEN_INJECTOR | Freq: Every day | SUBCUTANEOUS | 11 refills | Status: DC

## 2021-05-21 NOTE — Progress Notes (Signed)
Subjective:    Patient ID: [name], female    DOB: 01/05/1961, 60 y.o.   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  Other: she requested to d/c multiple daily injections.   Interval history: no cbg record, but states cbg's vary from 155-173.  pt states she feels well in general.  She takes meds as rx'ed.   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   Diabetes mellitus without complication ([facility])    Ectopic pregnancy    Gall stones    GERD (gastroesophageal reflux disease)    Hyperlipidemia    Hypertension    Hypothyroidism    Thyroid disease    hypothyroidism    Past Surgical History:  Procedure Laterality Date   dilatation and curettage     ENDOMETRIAL ABLATION     resection of the left oviduct and ovary     TUBAL LIGATION     VIDEO ASSISTED THORACOSCOPY (VATS)/ LOBECTOMY Right 10/30/2016   Procedure: VIDEO ASSISTED THORACOSCOPY (VATS)/RIGHT UPPER LOBECTOMY;  Surgeon: Melrose Nakayama, MD;  Location: [facility];  Service: Thoracic;  Laterality: Right;    Social History   Socioeconomic History   Marital status: Single    Spouse name: Not on file   Number of children: Not on file   Years of education: Not on file   Highest education level: Not on file  Occupational History   Not on file  Tobacco Use   Smoking status: Former    Packs/day: 0.00    Years: 44.00    Pack years: 0.00    Types: Cigarettes    Quit date: 10/25/2016    Years since quitting: 4.5   Smokeless tobacco: Never   Tobacco comments:    BARELY A  PACK  Substance and Sexual Activity   Alcohol use: No    Drug use: No   Sexual activity: Not on file  Other Topics Concern   Not on file  Social History Narrative   Not on file   Social Determinants of Health   Financial Resource Strain: Not on file  Food Insecurity: Not on file  Transportation Needs: Not on file  Physical Activity: Not on file  Stress: Not on file  Social Connections: Not on file  Intimate Partner Violence: Not on file    Current Outpatient Medications on File Prior to Visit  Medication Sig Dispense Refill   aspirin EC 81 MG tablet Take 81 mg by mouth daily.     buPROPion (WELLBUTRIN XL) 150 MG 24 hr tablet Take 150 mg by mouth 2 (two) times daily.  2   Garlic 314 MG CAPS Take 1 capsule by mouth daily.     gemfibrozil (LOPID) 600 MG tablet Take 600 mg by mouth 2 (two) times daily before a meal.     levothyroxine (SYNTHROID, LEVOTHROID) 50 MCG tablet Take 50 mcg by mouth daily before breakfast.     lisinopril (PRINIVIL,ZESTRIL) 20 MG tablet Take 10 mg by mouth every evening.      metFORMIN (GLUCOPHAGE) 1000 MG tablet Take 1 tablet (1,000 mg total) by mouth  daily with breakfast. 90 tablet 3   Omega-3 Fatty Acids (FISH OIL) 1000 MG CAPS Take 1 capsule by mouth daily.     simvastatin (ZOCOR) 80 MG tablet Take 80 mg by mouth daily.     desloratadine (CLARINEX) 5 MG tablet Take 5 mg by mouth daily.     ergocalciferol (VITAMIN D2) 50000 units capsule Take 50,000 Units by mouth once a week. X 8 weeks     Ferrous Fumarate 29 MG TABS Take 1 tablet by mouth daily.     meclizine (ANTIVERT) 25 MG tablet Take 25 mg by mouth 3 (three) times daily as needed for dizziness.     NICOTINE STEP 1 21 MG/24HR patch APPLY 1 PATCH EVERY 24 HOURS AFTER REMOVING OLD PATCH  0   omeprazole (PRILOSEC) 20 MG capsule Take 20 mg by mouth 2 (two) times daily before a meal.     oxyCODONE (OXY IR/ROXICODONE) 5 MG immediate release tablet Take 1-2 tablets (5-10 mg total) by mouth every 6 (six) hours as needed for severe pain. (Patient not taking:  Reported on 01/14/2017) 30 tablet 0   triamterene-hydrochlorothiazide (MAXZIDE-25) 37.5-25 MG tablet Take 1 tablet by mouth daily.     No current facility-administered medications on file prior to visit.    Allergies  Allergen Reactions   No Known Allergies     Family History  Problem Relation Age of Onset   Heart disease Mother    Heart disease Father    Diabetes Sister    Hypertension Sister    Cancer Maternal Aunt        BREAST CANCER    BP 130/70 (BP Location: Right Arm, Patient Position: Sitting, Cuff Size: Large)   Pulse 72   Ht 6\' 1"  (1.854 m)   Wt 243 lb 9.6 oz (110.5 kg)   SpO2 98%   BMI 32.14 kg/m    Review of Systems Denies n/v.  She denies hypoglycemia.      Objective:   Physical Exam Pulses: dorsalis pedis intact bilat.   MSK: no deformity of the feet CV: 1+ bilat leg edema, and bilat vv's.   Skin:  no ulcer on the feet.  normal color and temp on the feet.   Neuro: sensation is intact to touch on the feet.  Ext: there is bilateral onychomycosis of the toenails.     Lab Results  Component Value Date   HGBA1C 7.1 (A) 05/21/2021      Assessment & Plan:  Insulin-requiring type 2 DM: uncontrolled.  She will need to d/c qd insulin in order to safely lower A1c.  Patient Instructions  Your blood pressure is high today.  Please see your primary care provider soon, to have it rechecked check your blood sugar twice a day.  vary the time of day when you check, between before the 3 meals, and at bedtime.  also check if you have symptoms of your blood sugar being too high or too low.  please keep a record of the readings and bring it to your next appointment here (or you can bring the meter itself).  You can write it on any piece of paper.  please call us sooner if your blood sugar goes below 70, or if most of your readings are over 200. We will need to take this complex situation in stages I have sent 2 prescriptions to your pharmacy: to double the [place], and  half the [facility]   Please continue the same metformin.   Please come back  for a follow-up appointment in 2 months.

## 2021-05-21 NOTE — Patient Instructions (Addendum)
Your blood pressure is high today.  Please see your primary care provider soon, to have it rechecked check your blood sugar twice a day.  vary the time of day when you check, between before the 3 meals, and at bedtime.  also check if you have symptoms of your blood sugar being too high or too low.  please keep a record of the readings and bring it to your next appointment here (or you can bring the meter itself).  You can write it on any piece of paper.  please call us sooner if your blood sugar goes below 70, or if most of your readings are over 200. We will need to take this complex situation in stages I have sent 2 prescriptions to your pharmacy: to double the [place], and half the [facility].   Please continue the same metformin.   Please come back for a follow-up appointment in 2 months.

## 2021-05-21 NOTE — Telephone Encounter (Addendum)
Patient Advocate Encounter   Received notification from [facility] that prior authorization for [facility] is required.   PA submitted on 05/21/2021 Key [id number] Status is APPROVED Through 05/22/2022 PA Case: [id number]    [facility] will continue to follow   Ronney Asters, CPhT Patient Advocate [facility] Phone: [phone] Fax:  [phone]

## 2021-05-22 ENCOUNTER — Other Ambulatory Visit (HOSPITAL_COMMUNITY): Payer: Self-pay

## 2021-05-22 ENCOUNTER — Telehealth: Payer: Self-pay | Admitting: Endocrinology

## 2021-05-22 MED ORDER — LANTUS SOLOSTAR 100 UNIT/ML ~~LOC~~ SOPN: 10.0000 [IU] | PEN_INJECTOR | SUBCUTANEOUS | 99 refills | Status: DC

## 2021-05-22 NOTE — Telephone Encounter (Signed)
-----   Message from Orpah Melter, CPhT sent at 05/22/2021  9:45 AM EDT ----- Regarding: CHANGE MEDICATION? Hello,  This patient's plan requires a prior authorization for Semglee, but does not for the medications below.  Can you send in a new RX for one of the ones listed below?  Lantus Levemir FlexTouch Tresiba FlexTouch Insulin glargine Insulin Glargine Solostar Insulin Glargine-yfgn   Thank you,  Venida Jarvis. Nadara Mustard, CPhT Patient Advocate [facility] Phone: [phone] Fax:  [phone]

## 2021-05-22 NOTE — Telephone Encounter (Signed)
-----   Message from Casandra Doffing, [place] sent at 05/22/2021 11:02 AM EDT ----- Regarding: FW: CHANGE MEDICATION? Please Advise  ----- Message ----- From: Orpah Melter, CPhT Sent: 05/22/2021   9:49 AM EDT To: Renato Shin, MD, Casandra Doffing, CMA Subject: CHANGE MEDICATION?                             Hello,  This patient's plan requires a prior authorization for Semglee, but does not for the medications below.  Can you send in a new RX for one of the ones listed below?  Lantus Levemir FlexTouch Tresiba FlexTouch Insulin glargine Insulin Glargine Solostar Insulin Glargine-yfgn   Thank you,  Venida Jarvis. Nadara Mustard, CPhT Patient Advocate [facility] Phone: [phone] Fax:  [phone]

## 2021-05-22 NOTE — Telephone Encounter (Signed)
done

## 2021-05-22 NOTE — Telephone Encounter (Signed)
I have sent a prescription to your pharmacy, to change.  TY

## 2021-05-31 NOTE — Progress Notes (Signed)
[facility]  [address] [place],  [place]  [address] [phone]  Clinic Day:  06/08/2021  Referring physician: Charlynn Court, NP  This document serves as a record of services personally performed by Marice Potter, MD. It was created on their behalf by Curry,Lauren E, a trained medical scribe. The creation of this record is based on the scribe's personal observations and the provider's statements to them.  HISTORY OF PRESENT ILLNESS:  The patient is a 60 y.o. female with stage IA3 (T1c N0 M0) lung adenocarcinoma, status post a right upper lobe segmental resection in January 2018.  As adjuvant chemotherapy was not warranted, the patient has since been followed conservatively.  She comes in today to review her most recent chest x-ray to ascertain her new disease baseline.  Since her last visit, the patient has been doing well.  The patient has not smoked since her lung cancer surgery.  She denies having any shortness of breath, hemoptysis, or other respiratory symptoms which concern her for disease recurrence.  PHYSICAL EXAM:  Blood pressure (!) 135/2, pulse 72, temperature 98.3 F (36.8 C), temperature source Oral, resp. rate 16, weight 246 lb (111.6 kg), SpO2 98 %. Wt Readings from Last 3 Encounters:  06/08/21 246 lb (111.6 kg)  05/21/21 243 lb 9.6 oz (110.5 kg)  02/20/21 257 lb 9.6 oz (116.8 kg)   Body mass index is 32.46 kg/m. Performance status (ECOG): 1 Physical Exam Constitutional:      Appearance: Normal appearance. She is not ill-appearing.  HENT:     Mouth/Throat:     Mouth: Mucous membranes are moist.     Pharynx: Oropharynx is clear. No oropharyngeal exudate or posterior oropharyngeal erythema.  Cardiovascular:     Rate and Rhythm: Normal rate and regular rhythm.     Heart sounds: No murmur heard.   No friction rub. No gallop.  Pulmonary:     Effort: Pulmonary effort is normal. No respiratory distress.     Breath sounds: Normal  breath sounds. No wheezing, rhonchi or rales.  Abdominal:     General: Bowel sounds are normal. There is no distension.     Palpations: Abdomen is soft. There is no mass.     Tenderness: There is no abdominal tenderness.  Musculoskeletal:        General: No swelling.     Right lower leg: No edema.     Left lower leg: No edema.  Lymphadenopathy:     Cervical: No cervical adenopathy.     Upper Body:     Right upper body: No supraclavicular or axillary adenopathy.     Left upper body: No supraclavicular or axillary adenopathy.     Lower Body: No right inguinal adenopathy. No left inguinal adenopathy.  Skin:    General: Skin is warm.     Coloration: Skin is not jaundiced.     Findings: No lesion or rash.  Neurological:     General: No focal deficit present.     Mental Status: She is alert and oriented to person, place, and time. Mental status is at baseline.     Cranial Nerves: Cranial nerves are intact.  Psychiatric:        Mood and Affect: Mood normal.        Behavior: Behavior normal.        Thought Content: Thought content normal.    LABS:   CBC Latest Ref Rng & Units 06/08/2021 12/07/2020 11/03/2016  WBC - 5.2  7.2 8.3  Hemoglobin 12.0 - 16.0 10.5(A) 11.0(A) 9.7(L)  Hematocrit 36 - 46 31(A) 33(A) 29.1(L)  Platelets 150 - 399 188 204 286   CMP Latest Ref Rng & Units 06/08/2021 12/07/2020 11/03/2016  Glucose 65 - 99 mg/dL - - 139(H)  BUN 4 - 21 27(A) 21 22(H)  Creatinine 0.5 - 1.1 1.5(A) 1.4(A) 1.02(H)  Sodium 137 - 147 139 142 137  Potassium 3.4 - 5.3 5.0 4.4 4.0  Chloride 99 - 108 106 108 99(L)  CO2 13 - 22 21 23(A) 26  Calcium 8.7 - 10.7 9.6 9.8 9.6  Total Protein 6.5 - 8.1 g/dL - - -  Total Bilirubin 0.3 - 1.2 mg/dL - - -  Alkaline Phos 25 - 125 69 81 -  AST 13 - 35 28 52(A) -  ALT 7 - 35 14 30 -    Ref. Range 06/08/2021 10:08  Iron Latest Ref Range: 28 - 170 ug/dL 102  UIBC Latest Units: ug/dL 311  TIBC Latest Ref Range: 250 - 450 ug/dL 413  Saturation Ratios Latest Ref  Range: 10.4 - 31.8 % 25  Ferritin Latest Ref Range: 11 - 307 ng/mL 151  Folate Latest Ref Range: >5.9 ng/mL 4.7 (L)  Vitamin B12 Latest Ref Range: 180 - 914 pg/mL 170 (L)   STUDIES:  Her chest x-ray today revealed the following: FINDINGS: Status post right upper lobectomy. Compensatory hyperexpansion of the right middle and lower lobes. Lung volumes are normal. No consolidative airspace disease. No pleural effusions. No pneumothorax. No pulmonary nodule or mass noted. Pulmonary vasculature and the cardiomediastinal silhouette are within normal limits. Atherosclerotic calcifications in the thoracic aorta.  IMPRESSION: 1. No radiographic evidence of acute cardiopulmonary disease. 2. Status post right upper lobectomy. 3. Aortic atherosclerosis.  ASSESSMENT & PLAN:  Assessment/Plan:  A 60 y.o. female with stage IA3 (T1c N0 M0) lung adenocarcinoma, status post a right upper lobe segmental resection in January 2018.  In clinic today, I went over her chest x-ray images with her, for which she could appreciate that she remains disease-free.  Clinically, the patient continues to do very well.  I am concerned with her anemia, for which a component is definitely related to her kidney disease.  Her labs do show she is deficient in both B12 and folate.  I will arrange for her to receive a protracted course of B12 injections to normalize her cobalamin stores.  She will also be told to take folate, at least 800 mcg daily.  Otherwise, I will see her back in another 6 months, with a chest x-ray being done on the day of her next visit for her continued radiographic lung cancer surveillance.  If her next chest x-ray remains normal at that time, it will mark her being 5 years cancer free for which I would consider cured of her disease.  The patient understands all the plans discussed today and is in agreement with them.     I, Rita Ohara, am acting as scribe for Marice Potter, MD    I have reviewed this  report as typed by the medical scribe, and it is complete and accurate.  Dequincy Macarthur Critchley, MD

## 2021-06-08 ENCOUNTER — Other Ambulatory Visit: Payer: Self-pay | Admitting: Oncology

## 2021-06-08 ENCOUNTER — Other Ambulatory Visit: Payer: Self-pay

## 2021-06-08 ENCOUNTER — Encounter: Payer: Self-pay | Admitting: Oncology

## 2021-06-08 ENCOUNTER — Inpatient Hospital Stay: Payer: 59 | Attending: Oncology | Admitting: Oncology

## 2021-06-08 ENCOUNTER — Inpatient Hospital Stay: Payer: 59

## 2021-06-08 ENCOUNTER — Telehealth: Payer: Self-pay | Admitting: Oncology

## 2021-06-08 VITALS — BP 135/2 | HR 72 | Temp 98.3°F | Resp 16 | Wt 246.0 lb

## 2021-06-08 DIAGNOSIS — C3411 Malignant neoplasm of upper lobe, right bronchus or lung: Secondary | ICD-10-CM

## 2021-06-08 DIAGNOSIS — N189 Chronic kidney disease, unspecified: Secondary | ICD-10-CM

## 2021-06-08 DIAGNOSIS — Z79899 Other long term (current) drug therapy: Secondary | ICD-10-CM | POA: Diagnosis not present

## 2021-06-08 DIAGNOSIS — D631 Anemia in chronic kidney disease: Secondary | ICD-10-CM

## 2021-06-08 DIAGNOSIS — E538 Deficiency of other specified B group vitamins: Secondary | ICD-10-CM | POA: Insufficient documentation

## 2021-06-08 DIAGNOSIS — C3490 Malignant neoplasm of unspecified part of unspecified bronchus or lung: Secondary | ICD-10-CM

## 2021-06-08 LAB — CBC AND DIFFERENTIAL
HCT: 31 — AB (ref 36–46)
Hemoglobin: 10.5 — AB (ref 12.0–16.0)
Neutrophils Absolute: 2.5
Platelets: 188 (ref 150–399)
WBC: 5.2

## 2021-06-08 LAB — HEPATIC FUNCTION PANEL
ALT: 14 (ref 7–35)
AST: 28 (ref 13–35)
Alkaline Phosphatase: 69 (ref 25–125)
Bilirubin, Total: 0.3

## 2021-06-08 LAB — BASIC METABOLIC PANEL
BUN: 27 — AB (ref 4–21)
CO2: 21 (ref 13–22)
Chloride: 106 (ref 99–108)
Creatinine: 1.5 — AB (ref 0.5–1.1)
Glucose: 166
Potassium: 5 (ref 3.4–5.3)
Sodium: 139 (ref 137–147)

## 2021-06-08 LAB — FERRITIN: Ferritin: 151 ng/mL (ref 11–307)

## 2021-06-08 LAB — VITAMIN B12: Vitamin B-12: 170 pg/mL — ABNORMAL LOW (ref 180–914)

## 2021-06-08 LAB — FOLATE: Folate: 4.7 ng/mL — ABNORMAL LOW (ref >5.9)

## 2021-06-08 LAB — COMPREHENSIVE METABOLIC PANEL
Albumin: 4.3 (ref 3.5–5.0)
Calcium: 9.6 (ref 8.7–10.7)

## 2021-06-08 LAB — IRON AND TIBC
Iron: 102 ug/dL (ref 28–170)
Saturation Ratios: 25 % (ref 10.4–31.8)
TIBC: 413 ug/dL (ref 250–450)
UIBC: 311 ug/dL

## 2021-06-08 LAB — CBC: RBC: 3.38 — AB (ref 3.87–5.11)

## 2021-06-08 NOTE — Telephone Encounter (Signed)
Per 9/2 LOS next appt scheduled and confirmed by patient

## 2021-06-17 DIAGNOSIS — E538 Deficiency of other specified B group vitamins: Secondary | ICD-10-CM | POA: Insufficient documentation

## 2021-06-21 ENCOUNTER — Telehealth: Payer: Self-pay

## 2021-06-21 NOTE — Telephone Encounter (Signed)
Dr. Bobby Rumpf had not yet ordered the B12 injections but is putting the order in today.  I called patient and told her that scheduling will be reaching out to her about the dates and time.

## 2021-06-27 ENCOUNTER — Encounter: Payer: Self-pay | Admitting: Oncology

## 2021-06-28 ENCOUNTER — Other Ambulatory Visit: Payer: Self-pay | Admitting: Hematology and Oncology

## 2021-06-28 ENCOUNTER — Other Ambulatory Visit: Payer: Self-pay

## 2021-06-28 ENCOUNTER — Inpatient Hospital Stay: Payer: 59

## 2021-06-28 VITALS — BP 156/70 | HR 80 | Temp 97.7°F | Resp 16 | Ht 73.0 in | Wt 246.0 lb

## 2021-06-28 DIAGNOSIS — E538 Deficiency of other specified B group vitamins: Secondary | ICD-10-CM | POA: Diagnosis not present

## 2021-06-28 MED ORDER — CYANOCOBALAMIN 1000 MCG/ML IJ SOLN: 1000.0000 ug | Freq: Once | INTRAMUSCULAR | 0 refills | Status: DC

## 2021-06-28 MED ORDER — CYANOCOBALAMIN 1000 MCG/ML IJ SOLN
1000.0000 ug | Freq: Once | INTRAMUSCULAR | Status: AC
  Administered 2021-06-28: 1000 ug via INTRAMUSCULAR
  Filled 2021-06-28: qty 1

## 2021-06-28 NOTE — Patient Instructions (Signed)

## 2021-07-02 ENCOUNTER — Encounter: Payer: Self-pay | Admitting: Endocrinology

## 2021-07-02 LAB — HM DIABETES EYE EXAM

## 2021-07-24 ENCOUNTER — Ambulatory Visit: Payer: 59 | Admitting: Endocrinology

## 2021-08-06 ENCOUNTER — Other Ambulatory Visit: Payer: Self-pay

## 2021-08-06 ENCOUNTER — Ambulatory Visit (INDEPENDENT_AMBULATORY_CARE_PROVIDER_SITE_OTHER): Payer: 59 | Admitting: Endocrinology

## 2021-08-06 VITALS — BP 140/70 | HR 76 | Ht 73.0 in | Wt 243.8 lb

## 2021-08-06 DIAGNOSIS — E119 Type 2 diabetes mellitus without complications: Secondary | ICD-10-CM | POA: Diagnosis not present

## 2021-08-06 LAB — POCT GLYCOSYLATED HEMOGLOBIN (HGB A1C): Hemoglobin A1C: 6.3 % — AB (ref 4.0–5.6)

## 2021-08-06 MED ORDER — OZEMPIC (2 MG/DOSE) 8 MG/3ML ~~LOC~~ SOPN: 2.0000 mg | PEN_INJECTOR | SUBCUTANEOUS | 3 refills | Status: DC

## 2021-08-06 MED ORDER — METFORMIN HCL ER 500 MG PO TB24: 500.0000 mg | ORAL_TABLET | Freq: Every day | ORAL | 3 refills | Status: AC

## 2021-08-06 NOTE — Patient Instructions (Addendum)
check your blood sugar twice a day.  vary the time of day when you check, between before the 3 meals, and at bedtime.  also check if you have symptoms of your blood sugar being too high or too low.  please keep a record of the readings and bring it to your next appointment here (or you can bring the meter itself).  You can write it on any piece of paper.  please call us sooner if your blood sugar goes below 70, or if most of your readings are over 200. I have sent 2 prescriptions to your pharmacy: to double the Ozempic, and to reduce the metformin You can stop taking the Semglee.   Please come back for a follow-up appointment in 3 months.

## 2021-08-06 NOTE — Progress Notes (Signed)
Subjective:    Patient ID: [name], female    DOB: March 08, 1961, 60 y.o.   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  Other: she requested to d/c multiple daily injections.   Interval history: no cbg record, but states cbg's vary from 111-178.  pt states she feels well in general.  She takes meds as rx'ed.   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   Diabetes mellitus without complication (HCC)    Ectopic pregnancy    Gall stones    GERD (gastroesophageal reflux disease)    Hyperlipidemia    Hypertension    Hypothyroidism    Thyroid disease    hypothyroidism    Past Surgical History:  Procedure Laterality Date   dilatation and curettage     ENDOMETRIAL ABLATION     resection of the left oviduct and ovary     TUBAL LIGATION     VIDEO ASSISTED THORACOSCOPY (VATS)/ LOBECTOMY Right 10/30/2016   Procedure: VIDEO ASSISTED THORACOSCOPY (VATS)/RIGHT UPPER LOBECTOMY;  Surgeon: Melrose Nakayama, MD;  Location: [facility] OR;  Service: Thoracic;  Laterality: Right;    Social History   Socioeconomic History   Marital status: Single    Spouse name: Not on file   Number of children: Not on file   Years of education: Not on file   Highest education level: Not on file  Occupational History   Not on file  Tobacco Use   Smoking status: Former    Packs/day: 0.00    Years: 44.00    Pack years: 0.00    Types: Cigarettes    Quit date: 10/25/2016    Years since quitting: 4.7   Smokeless tobacco: Never   Tobacco comments:    BARELY A  PACK  Substance and Sexual Activity   Alcohol use: No    Drug use: No   Sexual activity: Not on file  Other Topics Concern   Not on file  Social History Narrative   Not on file   Social Determinants of Health   Financial Resource Strain: Not on file  Food Insecurity: Not on file  Transportation Needs: Not on file  Physical Activity: Not on file  Stress: Not on file  Social Connections: Not on file  Intimate Partner Violence: Not on file    Current Outpatient Medications on File Prior to Visit  Medication Sig Dispense Refill   aspirin EC 81 MG tablet Take 81 mg by mouth daily.     buPROPion (WELLBUTRIN XL) 150 MG 24 hr tablet Take 150 mg by mouth 2 (two) times daily.  2   buPROPion (WELLBUTRIN XL) 300 MG 24 hr tablet Take 300 mg by mouth daily as needed.     famotidine (PEPCID) 20 MG tablet Take 20 mg by mouth 2 (two) times daily.     Garlic 614 MG CAPS Take 1 capsule by mouth daily.     gemfibrozil (LOPID) 600 MG tablet Take 600 mg by mouth 2 (two) times daily before a meal.     LANTUS SOLOSTAR 100 UNIT/ML Solostar Pen Inject into the  skin.     levocetirizine (XYZAL) 5 MG tablet Take 5 mg by mouth daily as needed.     levothyroxine (SYNTHROID, LEVOTHROID) 50 MCG tablet Take 50 mcg by mouth daily before breakfast.     lisinopril (PRINIVIL,ZESTRIL) 20 MG tablet Take 10 mg by mouth every evening.      meloxicam (MOBIC) 15 MG tablet Take 15 mg by mouth daily.     Omega-3 Fatty Acids (FISH OIL) 1000 MG CAPS Take 1 capsule by mouth daily.     pantoprazole (PROTONIX) 40 MG tablet Take 40 mg by mouth daily.     RELION PEN NEEDLES 31G X 6 MM MISC USE 1 ONCE DAILY     simvastatin (ZOCOR) 80 MG tablet Take 80 mg by mouth daily.     sulfamethoxazole-trimethoprim (BACTRIM DS) 800-160 MG tablet SMARTSIG:1 Tablet(s) By Mouth Every 12 Hours     trimethoprim (TRIMPEX) 100 MG tablet Take 100 mg by mouth daily.     ergocalciferol (VITAMIN D2) 50000 units capsule Take 50,000 Units by mouth once a week. X 8 weeks     Ferrous Fumarate 29 MG TABS Take 1  tablet by mouth daily.     meclizine (ANTIVERT) 25 MG tablet Take 25 mg by mouth 3 (three) times daily as needed for dizziness.     omeprazole (PRILOSEC) 20 MG capsule Take 20 mg by mouth 2 (two) times daily before a meal.     No current facility-administered medications on file prior to visit.    Allergies  Allergen Reactions   No Known Allergies    Macrodantin [Nitrofurantoin] Rash    Family History  Problem Relation Age of Onset   Heart disease Mother    Heart disease Father    Diabetes Sister    Hypertension Sister    Cancer Maternal Aunt        BREAST CANCER    BP 140/70 (BP Location: Right Arm, Patient Position: Sitting, Cuff Size: Large)   Pulse 76   Ht 6\' 1"  (1.854 m)   Wt 243 lb 12.8 oz (110.6 kg)   SpO2 96%   BMI 32.17 kg/m    Review of Systems She denies hypoglycemia/n/v/HB    Objective:   Physical Exam Pulses: dorsalis pedis intact bilat.   MSK: no deformity of the feet CV: 2+ bilat leg edema, and bilat vv's.   Skin:  no ulcer on the feet.  normal color and temp on the feet.   Neuro: sensation is intact to touch on the feet.  Ext: there is bilateral onychomycosis of the toenails.    Lab Results  Component Value Date   HGBA1C 6.3 (A) 08/06/2021    Lab Results  Component Value Date   ALT 14 06/08/2021   AST 28 06/08/2021   ALKPHOS 69 06/08/2021   BILITOT 0.5 11/01/2016   Lab Results  Component Value Date   CREATININE 1.5 (A) 06/08/2021   BUN 27 (A) 06/08/2021   NA 139 06/08/2021   K 5.0 06/08/2021   CL 106 06/08/2021   CO2 21 06/08/2021        Assessment & Plan:  Insulin-requiring type 2 DM: overcontrolled, for this insulin regimen.    Patient Instructions  check your blood sugar twice a day.  vary the time of day when you check, between before the 3 meals, and at bedtime.  also check if you have symptoms of your blood sugar being too high or too low.  please keep a record of the readings  and bring it to your next appointment here (or  you can bring the meter itself).  You can write it on any piece of paper.  please call us sooner if your blood sugar goes below 70, or if most of your readings are over 200. I have sent 2 prescriptions to your pharmacy: to double the Ozempic, and to reduce the metformin You can stop taking the Semglee.   Please come back for a follow-up appointment in 3 months.

## 2021-08-08 ENCOUNTER — Other Ambulatory Visit: Payer: Self-pay

## 2021-08-08 ENCOUNTER — Telehealth: Payer: Self-pay | Admitting: Endocrinology

## 2021-08-08 DIAGNOSIS — E119 Type 2 diabetes mellitus without complications: Secondary | ICD-10-CM

## 2021-08-08 MED ORDER — OZEMPIC (2 MG/DOSE) 8 MG/3ML ~~LOC~~ SOPN: 2.0000 mg | PEN_INJECTOR | SUBCUTANEOUS | 3 refills | Status: DC

## 2021-08-08 NOTE — Telephone Encounter (Signed)
Rx sent 

## 2021-08-08 NOTE — Telephone Encounter (Signed)
Please send Semaglutide, 2 MG/DOSE, (OZEMPIC, 2 MG/DOSE,) 8 MG/3ML SOPN    [facility] [place], [place]  Pt contact [phone]

## 2021-08-14 ENCOUNTER — Telehealth: Payer: Self-pay | Admitting: Endocrinology

## 2021-08-14 ENCOUNTER — Other Ambulatory Visit: Payer: Self-pay | Admitting: Endocrinology

## 2021-08-14 MED ORDER — TRULICITY 4.5 MG/0.5ML ~~LOC~~ SOAJ: 4.5000 mg | SUBCUTANEOUS | 3 refills | Status: DC

## 2021-08-14 NOTE — Telephone Encounter (Signed)
Pt calling in and her Ozempic 2mg  is on back order. [facility] DOES have 1mg . Wants to if she can take 1mg  twice? [facility] would need a prescription for 1mg .   [facility] [place], [place] contact [phone]

## 2021-08-14 NOTE — Telephone Encounter (Signed)
Message sent thru MyChart 

## 2021-09-03 ENCOUNTER — Other Ambulatory Visit: Payer: Self-pay | Admitting: Hematology and Oncology

## 2021-09-03 DIAGNOSIS — E538 Deficiency of other specified B group vitamins: Secondary | ICD-10-CM

## 2021-11-06 ENCOUNTER — Other Ambulatory Visit: Payer: 59

## 2021-11-06 ENCOUNTER — Encounter: Payer: Self-pay | Admitting: Endocrinology

## 2021-11-06 ENCOUNTER — Ambulatory Visit (INDEPENDENT_AMBULATORY_CARE_PROVIDER_SITE_OTHER): Payer: 59 | Admitting: Endocrinology

## 2021-11-06 ENCOUNTER — Other Ambulatory Visit: Payer: Self-pay

## 2021-11-06 VITALS — BP 142/78 | HR 75 | Ht 73.0 in | Wt 239.0 lb

## 2021-11-06 DIAGNOSIS — E119 Type 2 diabetes mellitus without complications: Secondary | ICD-10-CM

## 2021-11-06 LAB — POCT GLYCOSYLATED HEMOGLOBIN (HGB A1C): Hemoglobin A1C: 6.7 % — AB (ref 4.0–5.6)

## 2021-11-06 MED ORDER — OZEMPIC (2 MG/DOSE) 8 MG/3ML ~~LOC~~ SOPN: 2.0000 mg | PEN_INJECTOR | SUBCUTANEOUS | 3 refills | Status: AC

## 2021-11-06 NOTE — Patient Instructions (Addendum)
check your blood sugar twice a day.  vary the time of day when you check, between before the 3 meals, and at bedtime.  also check if you have symptoms of your blood sugar being too high or too low.  please keep a record of the readings and bring it to your next appointment here (or you can bring the meter itself).  You can write it on any piece of paper.  please call us sooner if your blood sugar goes below 70, or if most of your readings are over 200. I have sent a prescription to your pharmacy, to try increasing the [place] again.   When you are on the higher Ozempic, you can stop taking the insulin.  Please continue the same metformin.   Please come back for a follow-up appointment in 2-3 months.

## 2021-11-06 NOTE — Progress Notes (Signed)
Subjective:    Patient ID: [name], female    DOB: [birth year], 61 y.o.   MRN: [id number]  HPI Pt returns for f/u of diabetes mellitus:  DM type: Insulin-requiring type 2 Dx'ed: 8119 Complications: stage 3 CRI Therapy: insulin since soon after dx, and Trulicity.   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   Other: she requested to d/c multiple daily injections.   Interval history: no cbg record, but states cbg's vary from 130-180.  pt states she feels well in general.  Ozempic was reduced, due to supply problems.  She takes Lantus 10 units qd, when she is on reduced Ozempic.   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   Diabetes mellitus without complication (HCC)    Ectopic pregnancy    Gall stones    GERD (gastroesophageal reflux disease)    Hyperlipidemia    Hypertension    Hypothyroidism    Thyroid disease    hypothyroidism    Past Surgical History:  Procedure Laterality Date   dilatation and curettage     ENDOMETRIAL ABLATION     resection of the left oviduct and ovary     TUBAL LIGATION     VIDEO ASSISTED THORACOSCOPY (VATS)/ LOBECTOMY Right 10/30/2016   Procedure: VIDEO ASSISTED THORACOSCOPY (VATS)/RIGHT UPPER LOBECTOMY;  Surgeon: Melrose Nakayama, MD;  Location: [facility] OR;  Service: Thoracic;  Laterality: Right;    Social History   Socioeconomic History   Marital status: Single    Spouse name: Not on file   Number of children: Not on file   Years of education: Not on file   Highest education level: Not on file  Occupational History   Not on file  Tobacco Use   Smoking status: Former    Packs/day: 0.00    Years: 44.00    Pack years: 0.00    Types: Cigarettes    Quit date: 10/25/2016    Years since quitting: 5.0   Smokeless  tobacco: Never   Tobacco comments:    BARELY A  PACK  Substance and Sexual Activity   Alcohol use: No   Drug use: No   Sexual activity: Not on file  Other Topics Concern   Not on file  Social History Narrative   Not on file   Social Determinants of Health   Financial Resource Strain: Not on file  Food Insecurity: Not on file  Transportation Needs: Not on file  Physical Activity: Not on file  Stress: Not on file  Social Connections: Not on file  Intimate Partner Violence: Not on file    Current Outpatient Medications on File Prior to Visit  Medication Sig Dispense Refill   aspirin EC 81 MG tablet Take 81 mg by mouth daily.     buPROPion (WELLBUTRIN XL) 150 MG 24 hr tablet Take 150 mg by mouth 2 (two) times daily.  2   buPROPion (WELLBUTRIN XL) 300 MG 24 hr tablet Take 300 mg by mouth daily as needed.     cyanocobalamin (,VITAMIN B-12,) 1000 MCG/ML injection INJECT 1 ML IN THE MUSCLE DAILY FOR 6 DAYS, THEN 1 ML WEEKLY FOR 4 WEEKS 10 mL 0   famotidine (PEPCID) 20 MG tablet Take 20 mg by mouth 2 (two) times daily.  Garlic 195 MG CAPS Take 1 capsule by mouth daily.     gemfibrozil (LOPID) 600 MG tablet Take 600 mg by mouth 2 (two) times daily before a meal.     levocetirizine (XYZAL) 5 MG tablet Take 5 mg by mouth daily as needed.     levothyroxine (SYNTHROID, LEVOTHROID) 50 MCG tablet Take 50 mcg by mouth daily before breakfast.     lisinopril (PRINIVIL,ZESTRIL) 20 MG tablet Take 10 mg by mouth every evening.      meloxicam (MOBIC) 15 MG tablet Take 15 mg by mouth daily.     metFORMIN (GLUCOPHAGE-XR) 500 MG 24 hr tablet Take 1 tablet (500 mg total) by mouth daily with breakfast. 90 tablet 3   Omega-3 Fatty Acids (FISH OIL) 1000 MG CAPS Take 1 capsule by mouth daily.     pantoprazole (PROTONIX) 40 MG tablet Take 40 mg by mouth daily.     RELION PEN NEEDLES 31G X 6 MM MISC USE 1 ONCE DAILY     simvastatin (ZOCOR) 80 MG tablet Take 80 mg by mouth daily.      sulfamethoxazole-trimethoprim (BACTRIM DS) 800-160 MG tablet SMARTSIG:1 Tablet(s) By Mouth Every 12 Hours     trimethoprim (TRIMPEX) 100 MG tablet Take 100 mg by mouth daily.     ergocalciferol (VITAMIN D2) 50000 units capsule Take 50,000 Units by mouth once a week. X 8 weeks     Ferrous Fumarate 29 MG TABS Take 1 tablet by mouth daily.     meclizine (ANTIVERT) 25 MG tablet Take 25 mg by mouth 3 (three) times daily as needed for dizziness.     omeprazole (PRILOSEC) 20 MG capsule Take 20 mg by mouth 2 (two) times daily before a meal.     No current facility-administered medications on file prior to visit.    Allergies  Allergen Reactions   No Known Allergies    Macrodantin [Nitrofurantoin] Rash    Family History  Problem Relation Age of Onset   Heart disease Mother    Heart disease Father    Diabetes Sister    Hypertension Sister    Cancer Maternal Aunt        BREAST CANCER    BP (!) 142/78 (BP Location: Left Arm, Patient Position: Sitting, Cuff Size: Normal)    Pulse 75    Ht 6\' 1"  (1.854 m)    Wt 239 lb (108.4 kg)    SpO2 97%    BMI 31.53 kg/m    Review of Systems Denies N/V/HB    Objective:   Physical Exam  Lab Results  Component Value Date   CREATININE 1.5 (A) 06/08/2021   BUN 27 (A) 06/08/2021   NA 139 06/08/2021   K 5.0 06/08/2021   CL 106 06/08/2021   CO2 21 06/08/2021    Lab Results  Component Value Date   HGBA1C 6.7 (A) 11/06/2021      Assessment & Plan:  Insulin-requiring type 2 DM: overcontrolled, for this qd insulin schedule  Patient Instructions  check your blood sugar twice a day.  vary the time of day when you check, between before the 3 meals, and at bedtime.  also check if you have symptoms of your blood sugar being too high or too low.  please keep a record of the readings and bring it to your next appointment here (or you can bring the meter itself).  You can write it on any piece of paper.  please call us sooner if your blood sugar goes  below  70, or if most of your readings are over 200. I have sent a prescription to your pharmacy, to try increasing the [place] again.   When you are on the higher Ozempic, you can stop taking the insulin.  Please continue the same metformin.   Please come back for a follow-up appointment in 2-3 months.

## 2021-11-09 LAB — FRUCTOSAMINE: Fructosamine: 249 umol/L (ref 205–285)

## 2021-11-29 NOTE — Progress Notes (Signed)
?[facility]  ?[address] ?[place],  [place]  [address] ?[phone] ? ?Clinic Day:  12/06/2021 ? ?Referring physician: Charlynn Court, NP ? ?This document serves as a record of services personally performed by Marice Potter, MD. It was created on their behalf by Curry,Lauren E, a trained medical scribe. The creation of this record is based on the scribe's personal observations and the provider's statements to them. ? ?HISTORY OF PRESENT ILLNESS:  ?The patient is a 61 y.o. female with stage IA3 (T1c N0 M0) lung adenocarcinoma, status post a right upper lobe segmental resection in January 2018.  As adjuvant chemotherapy was not warranted, the patient has since been followed conservatively.  She comes in today to review her most recent chest x-ray to ascertain her new disease baseline.  Since her last visit, the patient has been doing well.  The patient has not smoked since her lung cancer surgery.  She denies having any shortness of breath, hemoptysis, or other respiratory symptoms which concern her for disease recurrence. ? ?PHYSICAL EXAM:  ?Blood pressure 117/61, pulse 80, temperature 98.2 ?F (36.8 ?C), resp. rate 16, height 6\' 1"  (1.854 m), weight 234 lb 1.6 oz (106.2 kg), SpO2 97 %. ?Wt Readings from Last 3 Encounters:  ?12/06/21 234 lb 1.6 oz (106.2 kg)  ?11/06/21 239 lb (108.4 kg)  ?08/06/21 243 lb 12.8 oz (110.6 kg)  ? ?Body mass index is 30.89 kg/m?Marland Kitchen ?Performance status (ECOG): 1 ?Physical Exam ?Constitutional:   ?   Appearance: Normal appearance. She is not ill-appearing.  ?HENT:  ?   Mouth/Throat:  ?   Mouth: Mucous membranes are moist.  ?   Pharynx: Oropharynx is clear. No oropharyngeal exudate or posterior oropharyngeal erythema.  ?Cardiovascular:  ?   Rate and Rhythm: Normal rate and regular rhythm.  ?   Heart sounds: No murmur heard. ?  No friction rub. No gallop.  ?Pulmonary:  ?   Effort: Pulmonary effort is normal. No respiratory distress.  ?   Breath sounds:  Normal breath sounds. No wheezing, rhonchi or rales.  ?Abdominal:  ?   General: Bowel sounds are normal. There is no distension.  ?   Palpations: Abdomen is soft. There is no mass.  ?   Tenderness: There is no abdominal tenderness.  ?Musculoskeletal:     ?   General: No swelling.  ?   Right lower leg: No edema.  ?   Left lower leg: No edema.  ?Lymphadenopathy:  ?   Cervical: No cervical adenopathy.  ?   Upper Body:  ?   Right upper body: No supraclavicular or axillary adenopathy.  ?   Left upper body: No supraclavicular or axillary adenopathy.  ?   Lower Body: No right inguinal adenopathy. No left inguinal adenopathy.  ?Skin: ?   General: Skin is warm.  ?   Coloration: Skin is not jaundiced.  ?   Findings: No lesion or rash.  ?Neurological:  ?   General: No focal deficit present.  ?   Mental Status: She is alert and oriented to person, place, and time. Mental status is at baseline.  ?Psychiatric:     ?   Mood and Affect: Mood normal.     ?   Behavior: Behavior normal.     ?   Thought Content: Thought content normal.  ? ? ?LABS:  ? ?CBC Latest Ref Rng & Units 12/06/2021 06/08/2021 12/07/2020  ?WBC - 7.8 5.2 7.2  ?Hemoglobin 12.0 - 16.0  11.5(A) 10.5(A) 11.0(A)  ?Hematocrit 36 - 46 34(A) 31(A) 33(A)  ?Platelets 150 - 399 235 188 204  ? ?CMP Latest Ref Rng & Units 12/06/2021 06/08/2021 12/07/2020  ?Glucose 65 - 99 mg/dL - - -  ?BUN 4 - 21 32(A) 27(A) 21  ?Creatinine 0.5 - 1.1 1.7(A) 1.5(A) 1.4(A)  ?Sodium 137 - 147 140 139 142  ?Potassium 3.4 - 5.3 4.7 5.0 4.4  ?Chloride 99 - 108 105 106 108  ?CO2 13 - 22 24(A) 21 23(A)  ?Calcium 8.7 - 10.7 9.7 9.6 9.8  ?Total Protein 6.5 - 8.1 g/dL - - -  ?Total Bilirubin 0.3 - 1.2 mg/dL - - -  ?Alkaline Phos 25 - 125 82 69 81  ?AST 13 - 35 31 28 52(A)  ?ALT 7 - 35 20 14 30   ? ? ?STUDIES:  ?Her chest x-ray done today revealed the following: ? ?FINDINGS:  ?The heart size and mediastinal contours are within normal limits.  ?Atherosclerotic calcification of the aorta is noted. No  ?consolidation,  effusion, or pneumothorax. Surgical changes are  ?present in the upper right lung. No obvious nodule or mass. Surgical  ?clips are noted in the right axilla. No acute osseous abnormality. ? ?IMPRESSION:  ?Stable chest with no evidence of nodule or mass.  ? ? ?ASSESSMENT & PLAN:  ?Assessment/Plan:  A 61 y.o. female with stage IA3 (T1c N0 M0) lung adenocarcinoma, status post a right upper lobe segmental resection in January 2018.  In clinic today, I went over her chest x-ray images with her, for which she could appreciate that she remains disease-free.  Clinically, the patient continues to do very well.  As she is 5 years cancer free,  I now consider her cured of her disease.  As that is the case, I do feel comfortable turning her care back over to her primary care office.  I would not have a problem seeing the patient in the future if new hematologic or oncologic issues arise that require repeat clinical assessment.  The patient understands all the plans discussed today and is in agreement with them.   ? ? ?I, Rita Ohara, am acting as scribe for Marice Potter, MD   ? ?I have reviewed this report as typed by the medical scribe, and it is complete and accurate. ? ?Cottrell Gentles Macarthur Critchley, MD ?  ? ? ?  ?

## 2021-12-06 ENCOUNTER — Other Ambulatory Visit: Payer: Self-pay | Admitting: Hematology and Oncology

## 2021-12-06 ENCOUNTER — Inpatient Hospital Stay: Payer: 59 | Attending: Oncology | Admitting: Oncology

## 2021-12-06 ENCOUNTER — Encounter: Payer: Self-pay | Admitting: Oncology

## 2021-12-06 ENCOUNTER — Other Ambulatory Visit: Payer: Self-pay

## 2021-12-06 ENCOUNTER — Inpatient Hospital Stay: Payer: 59

## 2021-12-06 VITALS — BP 117/61 | HR 80 | Temp 98.2°F | Resp 16 | Ht 73.0 in | Wt 234.1 lb

## 2021-12-06 DIAGNOSIS — N189 Chronic kidney disease, unspecified: Secondary | ICD-10-CM

## 2021-12-06 DIAGNOSIS — D631 Anemia in chronic kidney disease: Secondary | ICD-10-CM

## 2021-12-06 DIAGNOSIS — C3411 Malignant neoplasm of upper lobe, right bronchus or lung: Secondary | ICD-10-CM | POA: Diagnosis not present

## 2021-12-06 LAB — CBC AND DIFFERENTIAL
HCT: 34 — AB (ref 36–46)
Hemoglobin: 11.5 — AB (ref 12.0–16.0)
Neutrophils Absolute: 3.04
Platelets: 235 (ref 150–399)
WBC: 7.8

## 2021-12-06 LAB — HEPATIC FUNCTION PANEL
ALT: 20 (ref 7–35)
AST: 31 (ref 13–35)
Alkaline Phosphatase: 82 (ref 25–125)
Bilirubin, Total: 0.4

## 2021-12-06 LAB — CBC
MCV: 89 (ref 81–99)
RBC: 3.77 — AB (ref 3.87–5.11)

## 2021-12-06 LAB — BASIC METABOLIC PANEL
BUN: 32 — AB (ref 4–21)
CO2: 24 — AB (ref 13–22)
Chloride: 105 (ref 99–108)
Creatinine: 1.7 — AB (ref 0.5–1.1)
Glucose: 124
Potassium: 4.7 (ref 3.4–5.3)
Sodium: 140 (ref 137–147)

## 2021-12-06 LAB — COMPREHENSIVE METABOLIC PANEL
Albumin: 4.6 (ref 3.5–5.0)
Calcium: 9.7 (ref 8.7–10.7)

## 2021-12-08 ENCOUNTER — Encounter: Payer: Self-pay | Admitting: Oncology

## 2022-02-06 ENCOUNTER — Ambulatory Visit: Payer: 59 | Admitting: Endocrinology

## 2022-09-05 DIAGNOSIS — J309 Allergic rhinitis, unspecified: Secondary | ICD-10-CM | POA: Diagnosis not present

## 2022-09-05 DIAGNOSIS — E785 Hyperlipidemia, unspecified: Secondary | ICD-10-CM | POA: Diagnosis not present

## 2022-09-05 DIAGNOSIS — Z1331 Encounter for screening for depression: Secondary | ICD-10-CM | POA: Diagnosis not present

## 2022-09-05 DIAGNOSIS — Z23 Encounter for immunization: Secondary | ICD-10-CM | POA: Diagnosis not present

## 2022-09-05 DIAGNOSIS — E1169 Type 2 diabetes mellitus with other specified complication: Secondary | ICD-10-CM | POA: Diagnosis not present

## 2022-09-05 DIAGNOSIS — I10 Essential (primary) hypertension: Secondary | ICD-10-CM | POA: Diagnosis not present

## 2022-09-09 DIAGNOSIS — M1711 Unilateral primary osteoarthritis, right knee: Secondary | ICD-10-CM | POA: Diagnosis not present

## 2022-09-09 DIAGNOSIS — I10 Essential (primary) hypertension: Secondary | ICD-10-CM | POA: Diagnosis not present

## 2022-09-09 DIAGNOSIS — E1169 Type 2 diabetes mellitus with other specified complication: Secondary | ICD-10-CM | POA: Diagnosis not present

## 2022-09-09 DIAGNOSIS — M25561 Pain in right knee: Secondary | ICD-10-CM | POA: Diagnosis not present

## 2022-09-12 DIAGNOSIS — E039 Hypothyroidism, unspecified: Secondary | ICD-10-CM | POA: Diagnosis not present

## 2022-09-12 DIAGNOSIS — E785 Hyperlipidemia, unspecified: Secondary | ICD-10-CM | POA: Diagnosis not present

## 2022-09-12 DIAGNOSIS — E1169 Type 2 diabetes mellitus with other specified complication: Secondary | ICD-10-CM | POA: Diagnosis not present

## 2022-09-24 DIAGNOSIS — M1711 Unilateral primary osteoarthritis, right knee: Secondary | ICD-10-CM | POA: Diagnosis not present

## 2022-10-30 DIAGNOSIS — E039 Hypothyroidism, unspecified: Secondary | ICD-10-CM | POA: Diagnosis not present

## 2022-11-04 DIAGNOSIS — E039 Hypothyroidism, unspecified: Secondary | ICD-10-CM | POA: Diagnosis not present

## 2022-11-04 DIAGNOSIS — N390 Urinary tract infection, site not specified: Secondary | ICD-10-CM | POA: Diagnosis not present

## 2022-11-04 DIAGNOSIS — R300 Dysuria: Secondary | ICD-10-CM | POA: Diagnosis not present

## 2022-11-18 DIAGNOSIS — R7989 Other specified abnormal findings of blood chemistry: Secondary | ICD-10-CM | POA: Diagnosis not present

## 2022-12-02 DIAGNOSIS — M1711 Unilateral primary osteoarthritis, right knee: Secondary | ICD-10-CM | POA: Diagnosis not present

## 2022-12-26 DIAGNOSIS — Z1331 Encounter for screening for depression: Secondary | ICD-10-CM | POA: Diagnosis not present

## 2022-12-26 DIAGNOSIS — E785 Hyperlipidemia, unspecified: Secondary | ICD-10-CM | POA: Diagnosis not present

## 2022-12-26 DIAGNOSIS — N390 Urinary tract infection, site not specified: Secondary | ICD-10-CM | POA: Diagnosis not present

## 2022-12-26 DIAGNOSIS — I10 Essential (primary) hypertension: Secondary | ICD-10-CM | POA: Diagnosis not present

## 2022-12-26 DIAGNOSIS — E1169 Type 2 diabetes mellitus with other specified complication: Secondary | ICD-10-CM | POA: Diagnosis not present

## 2022-12-26 DIAGNOSIS — Z6831 Body mass index (BMI) 31.0-31.9, adult: Secondary | ICD-10-CM | POA: Diagnosis not present

## 2022-12-26 DIAGNOSIS — K219 Gastro-esophageal reflux disease without esophagitis: Secondary | ICD-10-CM | POA: Diagnosis not present

## 2023-03-06 DIAGNOSIS — M1711 Unilateral primary osteoarthritis, right knee: Secondary | ICD-10-CM | POA: Diagnosis not present

## 2023-04-07 DIAGNOSIS — N1832 Chronic kidney disease, stage 3b: Secondary | ICD-10-CM | POA: Diagnosis not present

## 2023-04-07 DIAGNOSIS — Z6831 Body mass index (BMI) 31.0-31.9, adult: Secondary | ICD-10-CM | POA: Diagnosis not present

## 2023-04-07 DIAGNOSIS — E1169 Type 2 diabetes mellitus with other specified complication: Secondary | ICD-10-CM | POA: Diagnosis not present

## 2023-04-07 DIAGNOSIS — E1122 Type 2 diabetes mellitus with diabetic chronic kidney disease: Secondary | ICD-10-CM | POA: Diagnosis not present

## 2023-04-07 DIAGNOSIS — I10 Essential (primary) hypertension: Secondary | ICD-10-CM | POA: Diagnosis not present

## 2023-04-07 DIAGNOSIS — N390 Urinary tract infection, site not specified: Secondary | ICD-10-CM | POA: Diagnosis not present

## 2023-04-07 DIAGNOSIS — E039 Hypothyroidism, unspecified: Secondary | ICD-10-CM | POA: Diagnosis not present

## 2023-04-07 DIAGNOSIS — E785 Hyperlipidemia, unspecified: Secondary | ICD-10-CM | POA: Diagnosis not present

## 2023-04-29 DIAGNOSIS — M1711 Unilateral primary osteoarthritis, right knee: Secondary | ICD-10-CM | POA: Diagnosis not present

## 2023-05-06 DIAGNOSIS — M1711 Unilateral primary osteoarthritis, right knee: Secondary | ICD-10-CM | POA: Diagnosis not present

## 2023-05-06 DIAGNOSIS — Z1212 Encounter for screening for malignant neoplasm of rectum: Secondary | ICD-10-CM | POA: Diagnosis not present

## 2023-05-06 DIAGNOSIS — Z1211 Encounter for screening for malignant neoplasm of colon: Secondary | ICD-10-CM | POA: Diagnosis not present

## 2023-05-09 DIAGNOSIS — E119 Type 2 diabetes mellitus without complications: Secondary | ICD-10-CM | POA: Diagnosis not present

## 2023-05-19 DIAGNOSIS — Z1231 Encounter for screening mammogram for malignant neoplasm of breast: Secondary | ICD-10-CM | POA: Diagnosis not present

## 2023-05-19 DIAGNOSIS — M1711 Unilateral primary osteoarthritis, right knee: Secondary | ICD-10-CM | POA: Diagnosis not present

## 2023-06-11 DIAGNOSIS — Z01818 Encounter for other preprocedural examination: Secondary | ICD-10-CM | POA: Diagnosis not present

## 2023-07-03 DIAGNOSIS — Z23 Encounter for immunization: Secondary | ICD-10-CM | POA: Diagnosis not present

## 2023-07-24 DIAGNOSIS — Z1211 Encounter for screening for malignant neoplasm of colon: Secondary | ICD-10-CM | POA: Diagnosis not present

## 2023-07-24 DIAGNOSIS — K5730 Diverticulosis of large intestine without perforation or abscess without bleeding: Secondary | ICD-10-CM | POA: Diagnosis not present

## 2023-07-24 DIAGNOSIS — K648 Other hemorrhoids: Secondary | ICD-10-CM | POA: Diagnosis not present

## 2023-07-24 DIAGNOSIS — R195 Other fecal abnormalities: Secondary | ICD-10-CM | POA: Diagnosis not present

## 2023-07-24 DIAGNOSIS — D126 Benign neoplasm of colon, unspecified: Secondary | ICD-10-CM | POA: Diagnosis not present

## 2023-07-24 DIAGNOSIS — K635 Polyp of colon: Secondary | ICD-10-CM | POA: Diagnosis not present

## 2023-07-24 DIAGNOSIS — D124 Benign neoplasm of descending colon: Secondary | ICD-10-CM | POA: Diagnosis not present

## 2023-07-24 DIAGNOSIS — K644 Residual hemorrhoidal skin tags: Secondary | ICD-10-CM | POA: Diagnosis not present

## 2023-07-24 DIAGNOSIS — Z860100 Personal history of colon polyps, unspecified: Secondary | ICD-10-CM | POA: Diagnosis not present

## 2023-07-28 DIAGNOSIS — E039 Hypothyroidism, unspecified: Secondary | ICD-10-CM | POA: Diagnosis not present

## 2023-07-28 DIAGNOSIS — E1122 Type 2 diabetes mellitus with diabetic chronic kidney disease: Secondary | ICD-10-CM | POA: Diagnosis not present

## 2023-07-28 DIAGNOSIS — I129 Hypertensive chronic kidney disease with stage 1 through stage 4 chronic kidney disease, or unspecified chronic kidney disease: Secondary | ICD-10-CM | POA: Diagnosis not present

## 2023-07-28 DIAGNOSIS — N1832 Chronic kidney disease, stage 3b: Secondary | ICD-10-CM | POA: Diagnosis not present

## 2023-10-10 DIAGNOSIS — N898 Other specified noninflammatory disorders of vagina: Secondary | ICD-10-CM | POA: Diagnosis not present

## 2023-10-10 DIAGNOSIS — R300 Dysuria: Secondary | ICD-10-CM | POA: Diagnosis not present

## 2023-10-10 DIAGNOSIS — N3000 Acute cystitis without hematuria: Secondary | ICD-10-CM | POA: Diagnosis not present

## 2023-10-10 DIAGNOSIS — B3731 Acute candidiasis of vulva and vagina: Secondary | ICD-10-CM | POA: Diagnosis not present

## 2023-10-20 DIAGNOSIS — E1122 Type 2 diabetes mellitus with diabetic chronic kidney disease: Secondary | ICD-10-CM | POA: Diagnosis not present

## 2023-10-20 DIAGNOSIS — N1832 Chronic kidney disease, stage 3b: Secondary | ICD-10-CM | POA: Diagnosis not present

## 2023-10-20 DIAGNOSIS — I129 Hypertensive chronic kidney disease with stage 1 through stage 4 chronic kidney disease, or unspecified chronic kidney disease: Secondary | ICD-10-CM | POA: Diagnosis not present

## 2023-10-20 DIAGNOSIS — E785 Hyperlipidemia, unspecified: Secondary | ICD-10-CM | POA: Diagnosis not present

## 2023-11-13 DIAGNOSIS — N1832 Chronic kidney disease, stage 3b: Secondary | ICD-10-CM | POA: Diagnosis not present

## 2024-02-05 DIAGNOSIS — E785 Hyperlipidemia, unspecified: Secondary | ICD-10-CM | POA: Diagnosis not present

## 2024-02-05 DIAGNOSIS — E1169 Type 2 diabetes mellitus with other specified complication: Secondary | ICD-10-CM | POA: Diagnosis not present

## 2024-02-05 DIAGNOSIS — N8110 Cystocele, unspecified: Secondary | ICD-10-CM | POA: Diagnosis not present

## 2024-02-05 DIAGNOSIS — E039 Hypothyroidism, unspecified: Secondary | ICD-10-CM | POA: Diagnosis not present

## 2024-02-05 DIAGNOSIS — Z7689 Persons encountering health services in other specified circumstances: Secondary | ICD-10-CM | POA: Diagnosis not present

## 2024-02-05 DIAGNOSIS — Z6833 Body mass index (BMI) 33.0-33.9, adult: Secondary | ICD-10-CM | POA: Diagnosis not present

## 2024-04-13 DIAGNOSIS — N812 Incomplete uterovaginal prolapse: Secondary | ICD-10-CM | POA: Diagnosis not present

## 2024-04-13 DIAGNOSIS — N952 Postmenopausal atrophic vaginitis: Secondary | ICD-10-CM | POA: Diagnosis not present

## 2024-04-13 DIAGNOSIS — R3915 Urgency of urination: Secondary | ICD-10-CM | POA: Diagnosis not present

## 2024-05-11 DIAGNOSIS — E119 Type 2 diabetes mellitus without complications: Secondary | ICD-10-CM | POA: Diagnosis not present

## 2024-05-12 DIAGNOSIS — E039 Hypothyroidism, unspecified: Secondary | ICD-10-CM | POA: Diagnosis not present

## 2024-05-12 DIAGNOSIS — E1169 Type 2 diabetes mellitus with other specified complication: Secondary | ICD-10-CM | POA: Diagnosis not present

## 2024-05-14 DIAGNOSIS — N3641 Hypermobility of urethra: Secondary | ICD-10-CM | POA: Diagnosis not present

## 2024-05-14 DIAGNOSIS — N8111 Cystocele, midline: Secondary | ICD-10-CM | POA: Diagnosis not present

## 2024-05-20 DIAGNOSIS — E1169 Type 2 diabetes mellitus with other specified complication: Secondary | ICD-10-CM | POA: Diagnosis not present

## 2024-05-20 DIAGNOSIS — Z1331 Encounter for screening for depression: Secondary | ICD-10-CM | POA: Diagnosis not present

## 2024-05-20 DIAGNOSIS — E785 Hyperlipidemia, unspecified: Secondary | ICD-10-CM | POA: Diagnosis not present

## 2024-05-20 DIAGNOSIS — I83891 Varicose veins of right lower extremities with other complications: Secondary | ICD-10-CM | POA: Diagnosis not present

## 2024-05-20 DIAGNOSIS — Z6834 Body mass index (BMI) 34.0-34.9, adult: Secondary | ICD-10-CM | POA: Diagnosis not present

## 2024-05-20 DIAGNOSIS — E039 Hypothyroidism, unspecified: Secondary | ICD-10-CM | POA: Diagnosis not present

## 2024-05-31 DIAGNOSIS — N8111 Cystocele, midline: Secondary | ICD-10-CM | POA: Diagnosis not present

## 2024-06-16 ENCOUNTER — Other Ambulatory Visit: Payer: Self-pay | Admitting: Physician Assistant

## 2024-06-16 ENCOUNTER — Encounter: Payer: Self-pay | Admitting: Oncology

## 2024-06-16 ENCOUNTER — Ambulatory Visit
Admission: RE | Admit: 2024-06-16 | Discharge: 2024-06-16 | Disposition: A | Discharge status: Home / Self Care | Source: Ambulatory Visit | Attending: Physician Assistant | Admitting: Physician Assistant

## 2024-06-16 DIAGNOSIS — Z1231 Encounter for screening mammogram for malignant neoplasm of breast: Secondary | ICD-10-CM

## 2024-06-22 DIAGNOSIS — E039 Hypothyroidism, unspecified: Secondary | ICD-10-CM | POA: Diagnosis not present

## 2024-06-22 DIAGNOSIS — I129 Hypertensive chronic kidney disease with stage 1 through stage 4 chronic kidney disease, or unspecified chronic kidney disease: Secondary | ICD-10-CM | POA: Diagnosis not present

## 2024-06-22 DIAGNOSIS — E1122 Type 2 diabetes mellitus with diabetic chronic kidney disease: Secondary | ICD-10-CM | POA: Diagnosis not present

## 2024-06-22 DIAGNOSIS — N1832 Chronic kidney disease, stage 3b: Secondary | ICD-10-CM | POA: Diagnosis not present

## 2024-06-29 DIAGNOSIS — Z23 Encounter for immunization: Secondary | ICD-10-CM | POA: Diagnosis not present

## 2024-07-10 DIAGNOSIS — E1165 Type 2 diabetes mellitus with hyperglycemia: Secondary | ICD-10-CM | POA: Diagnosis not present

## 2024-07-10 DIAGNOSIS — I10 Essential (primary) hypertension: Secondary | ICD-10-CM | POA: Diagnosis not present

## 2024-07-10 DIAGNOSIS — I129 Hypertensive chronic kidney disease with stage 1 through stage 4 chronic kidney disease, or unspecified chronic kidney disease: Secondary | ICD-10-CM | POA: Diagnosis not present

## 2024-07-10 DIAGNOSIS — R9431 Abnormal electrocardiogram [ECG] [EKG]: Secondary | ICD-10-CM | POA: Diagnosis not present

## 2024-07-10 DIAGNOSIS — Z7984 Long term (current) use of oral hypoglycemic drugs: Secondary | ICD-10-CM | POA: Diagnosis not present

## 2024-07-10 DIAGNOSIS — N183 Chronic kidney disease, stage 3 unspecified: Secondary | ICD-10-CM | POA: Diagnosis not present

## 2024-07-10 DIAGNOSIS — R3589 Other polyuria: Secondary | ICD-10-CM | POA: Diagnosis not present

## 2024-07-10 DIAGNOSIS — Z7982 Long term (current) use of aspirin: Secondary | ICD-10-CM | POA: Diagnosis not present

## 2024-07-10 DIAGNOSIS — E1122 Type 2 diabetes mellitus with diabetic chronic kidney disease: Secondary | ICD-10-CM | POA: Diagnosis not present

## 2024-07-10 DIAGNOSIS — Z79899 Other long term (current) drug therapy: Secondary | ICD-10-CM | POA: Diagnosis not present

## 2024-07-12 DIAGNOSIS — E1165 Type 2 diabetes mellitus with hyperglycemia: Secondary | ICD-10-CM | POA: Diagnosis not present

## 2024-07-12 DIAGNOSIS — Z1231 Encounter for screening mammogram for malignant neoplasm of breast: Secondary | ICD-10-CM | POA: Diagnosis not present

## 2024-07-12 DIAGNOSIS — Z789 Other specified health status: Secondary | ICD-10-CM | POA: Diagnosis not present

## 2024-07-12 DIAGNOSIS — Z6834 Body mass index (BMI) 34.0-34.9, adult: Secondary | ICD-10-CM | POA: Diagnosis not present

## 2024-08-11 DIAGNOSIS — E1169 Type 2 diabetes mellitus with other specified complication: Secondary | ICD-10-CM | POA: Diagnosis not present

## 2024-08-11 DIAGNOSIS — E039 Hypothyroidism, unspecified: Secondary | ICD-10-CM | POA: Diagnosis not present

## 2024-08-18 DIAGNOSIS — N1831 Chronic kidney disease, stage 3a: Secondary | ICD-10-CM | POA: Diagnosis not present

## 2024-08-18 DIAGNOSIS — E039 Hypothyroidism, unspecified: Secondary | ICD-10-CM | POA: Diagnosis not present

## 2024-08-18 DIAGNOSIS — Z6834 Body mass index (BMI) 34.0-34.9, adult: Secondary | ICD-10-CM | POA: Diagnosis not present

## 2024-08-18 DIAGNOSIS — E1169 Type 2 diabetes mellitus with other specified complication: Secondary | ICD-10-CM | POA: Diagnosis not present

## 2024-08-18 DIAGNOSIS — E785 Hyperlipidemia, unspecified: Secondary | ICD-10-CM | POA: Diagnosis not present
